# Patient Record
Sex: Female | Born: 1960 | Race: White | Hispanic: No | Marital: Married | Smoking: Never smoker
Health system: Southern US, Community
[De-identification: ages and names within clinical notes are randomized; demographics above are authoritative.]

## PROBLEM LIST (undated history)

## (undated) DIAGNOSIS — I10 Essential (primary) hypertension: Secondary | ICD-10-CM

## (undated) DIAGNOSIS — J45909 Unspecified asthma, uncomplicated: Secondary | ICD-10-CM

## (undated) DIAGNOSIS — T4145XA Adverse effect of unspecified anesthetic, initial encounter: Secondary | ICD-10-CM

## (undated) DIAGNOSIS — T8859XA Other complications of anesthesia, initial encounter: Secondary | ICD-10-CM

## (undated) DIAGNOSIS — Z9889 Other specified postprocedural states: Secondary | ICD-10-CM

## (undated) DIAGNOSIS — R112 Nausea with vomiting, unspecified: Secondary | ICD-10-CM

## (undated) DIAGNOSIS — K219 Gastro-esophageal reflux disease without esophagitis: Secondary | ICD-10-CM

## (undated) DIAGNOSIS — R609 Edema, unspecified: Secondary | ICD-10-CM

## (undated) DIAGNOSIS — F419 Anxiety disorder, unspecified: Secondary | ICD-10-CM

## (undated) DIAGNOSIS — IMO0001 Reserved for inherently not codable concepts without codable children: Secondary | ICD-10-CM

## (undated) DIAGNOSIS — M1990 Unspecified osteoarthritis, unspecified site: Secondary | ICD-10-CM

## (undated) HISTORY — DX: Unspecified osteoarthritis, unspecified site: M19.90

## (undated) HISTORY — DX: Essential (primary) hypertension: I10

## (undated) HISTORY — PX: FOOT SURGERY: SHX648

## (undated) HISTORY — PX: JOINT REPLACEMENT: SHX530

## (undated) HISTORY — DX: Gastro-esophageal reflux disease without esophagitis: K21.9

## (undated) HISTORY — DX: Reserved for inherently not codable concepts without codable children: IMO0001

## (undated) HISTORY — DX: Edema, unspecified: R60.9

---

## 2005-04-25 HISTORY — PX: TOTAL KNEE ARTHROPLASTY: SHX125

## 2006-03-27 ENCOUNTER — Inpatient Hospital Stay (HOSPITAL_COMMUNITY): Admission: RE | Admit: 2006-03-27 | Discharge: 2006-03-30 | Payer: Self-pay | Admitting: Orthopedic Surgery

## 2009-04-25 HISTORY — PX: TOTAL ABDOMINAL HYSTERECTOMY: SHX209

## 2010-11-18 ENCOUNTER — Encounter: Payer: Self-pay | Admitting: Cardiovascular Disease

## 2010-11-18 ENCOUNTER — Ambulatory Visit (INDEPENDENT_AMBULATORY_CARE_PROVIDER_SITE_OTHER): Payer: PRIVATE HEALTH INSURANCE | Admitting: Cardiovascular Disease

## 2010-11-18 ENCOUNTER — Encounter: Payer: Self-pay | Admitting: *Deleted

## 2010-11-18 DIAGNOSIS — R079 Chest pain, unspecified: Secondary | ICD-10-CM | POA: Insufficient documentation

## 2010-11-18 DIAGNOSIS — R011 Cardiac murmur, unspecified: Secondary | ICD-10-CM | POA: Insufficient documentation

## 2010-11-18 NOTE — Assessment & Plan Note (Signed)
The patient seems to have 2 different types of chest discomfort. Her recent sharp chest pain seems to be pleuritic in nature and that seems to be improving. However, the chest tightness is somewhat concerning especially that it's associated with significant exertional dyspnea. It's not always exertional though and has been happening at rest on few occasions. I think this require further investigation. I recommend evaluation with a nuclear stress test to rule out underlying cardiac ischemia. In the meantime, I asked her to take aspirin 81 mg once daily.

## 2010-11-18 NOTE — Assessment & Plan Note (Signed)
The patient does have a heart murmur which seems to be suggestive of aortic sclerosis. However, due to her symptoms of dyspnea I will go ahead and obtain an echocardiogram to evaluate for other etiologies. She will followup after her cardiac testing.

## 2010-11-18 NOTE — Progress Notes (Signed)
HPI  This is a 50 year old female who is referred by Dr. Manson Passey for evaluation of chest pain and dyspnea. The patient has been having symptoms of substernal chest tightness which usually lasts for a few minutes and can happen at rest or with activities over the last 5-6 weeks. This is associated with increased exertional dyspnea. This last weekend she had a different kind of discomfort with what which was sharp radiating to her back and was worse with breathing. She went to urgent care and was diagnosed with bronchitis and possible pleurisy. She was given antibiotics. That discomfort seems to be better. However it is different from the other substernal chest tightness that she's been having for about 6 weeks now. She denies any previous cardiac history. She had an ECG done which showed normal sinus rhythm without evidence of ischemia. Her labs showed negative troponin and overall were unremarkable. She denies any orthopnea, PND or lower extremity edema. There is no reported palpitations, syncope or presyncope. She takes metoprolol for hypertension. She doesn't know if she has hyperlipidemia or not. The patient does not exercise regularly but she is very active at her work which requires her to walk about 5 positive a day. 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  Allergies  Allergen Reactions  . Frovatriptan      No current outpatient prescriptions on file prior to visit.     Past Medical History  Diagnosis Date  . Hypertension   . Reflux   . Osteoarthritis      Past Surgical History  Procedure Date  . Total knee arthroplasty 2007    left  . Total abdominal hysterectomy 2011     No family history on file.   History   Social History  . Marital Status: Married    Spouse Name: N/A    Number of Children: N/A  . Years of Education: N/A   Occupational History  . Not on file.   Social History Main Topics  . Smoking status: Never Smoker   . Smokeless tobacco: Never  Used  . Alcohol Use: Not on file  . Drug Use: Not on file  . Sexually Active: Not on file   Other Topics Concern  . Not on file   Social History Narrative  . No narrative on file     ROS Constitutional: Negative for fever, chills, diaphoresis, activity change, appetite change and fatigue.  HENT: Negative for hearing loss, nosebleeds, congestion, sore throat, facial swelling, drooling, trouble swallowing, neck pain, voice change, sinus pressure and tinnitus.  Eyes: Negative for photophobia, pain, discharge and visual disturbance.  Respiratory: Negative for apnea, cough  and wheezing.  Cardiovascular: Negative for palpitations and leg swelling.  Gastrointestinal: Negative for nausea, vomiting, abdominal pain, diarrhea, constipation, blood in stool and abdominal distention.  Genitourinary: Negative for dysuria, urgency, frequency, hematuria and decreased urine volume.  Musculoskeletal: Negative for myalgias, back pain, joint swelling, arthralgias and gait problem.  Skin: Negative for color change, pallor, rash and wound.  Neurological: Negative for dizziness, tremors, seizures, syncope, speech difficulty, weakness, light-headedness, numbness and headaches.  Psychiatric/Behavioral: Negative for suicidal ideas, hallucinations, behavioral problems and agitation. The patient is not nervous/anxious.     PHYSICAL EXAM   BP 142/86  Pulse 61  Ht 5\' 11"  (1.803 m)  Wt 202 lb (91.627 kg)  BMI 28.17 kg/m2  SpO2 97%  Constitutional: She is oriented to person, place, and time. She appears well-developed and well-nourished. No distress.  HENT: No nasal  discharge.  Head: Normocephalic and atraumatic.  Eyes: Pupils are equal, round, and reactive to light. Right eye exhibits no discharge. Left eye exhibits no discharge.  Neck: Normal range of motion. Neck supple. No JVD present. No thyromegaly present.  Cardiovascular: Normal rate, regular rhythm, normal heart sounds and intact distal pulses.  Exam reveals no gallop and no friction rub.  There is a 2/6 systolic ejection murmur in the aortic area as well as the left sternal border. Pulmonary/Chest: Effort normal and breath sounds normal. No stridor. No respiratory distress. She has no wheezes. She has no rales. She exhibits no tenderness.  Abdominal: Soft. Bowel sounds are normal. She exhibits no distension. There is no tenderness. There is no rebound and no guarding.  Musculoskeletal: Normal range of motion. She exhibits no edema and no tenderness.  Neurological: She is alert and oriented to person, place, and time. Coordination normal.  Skin: Skin is warm and dry. No rash noted. She is not diaphoretic. No erythema. No pallor.  Psychiatric: She has a normal mood and affect. Her behavior is normal. Judgment and thought content normal.    EKG: Recent ECG was reviewed and showed normal sinus rhythm without significant ST or T wave changes. No evidence of prior infarcts.   ASSESSMENT AND PLAN

## 2010-11-18 NOTE — Patient Instructions (Signed)
Your physician recommends that you schedule a follow-up appointment in: AFTER TESTING  Your physician has requested that you have en exercise stress myoview. For further information please visit [email or web address] Please follow instruction sheet, as given.AT [facility]   Your physician has requested that you have an echocardiogram. Echocardiography is a painless test that uses sound waves to create images of your heart. It provides your doctor with information about the size and shape of your heart and how well your heart's chambers and valves are working. This procedure takes approximately one hour. There are no restrictions for this procedure.AT [facility]

## 2010-11-29 ENCOUNTER — Ambulatory Visit (INDEPENDENT_AMBULATORY_CARE_PROVIDER_SITE_OTHER): Payer: PRIVATE HEALTH INSURANCE | Admitting: Cardiovascular Disease

## 2010-11-29 ENCOUNTER — Encounter: Payer: Self-pay | Admitting: Cardiovascular Disease

## 2010-11-29 DIAGNOSIS — R079 Chest pain, unspecified: Secondary | ICD-10-CM

## 2010-11-29 DIAGNOSIS — R42 Dizziness and giddiness: Secondary | ICD-10-CM

## 2010-11-29 DIAGNOSIS — R011 Cardiac murmur, unspecified: Secondary | ICD-10-CM

## 2010-11-29 NOTE — Assessment & Plan Note (Signed)
The patient had a nuclear stress test performed which showed no evidence of ischemia with normal ejection fraction of 56%. She was able to exercise for 7 minutes and 30 seconds without any chest pain or ischemic EKG changes. No further cardiac evaluation is needed at this time. If her symptoms do not improve in the next few weeks, I advised her to followup with her primary care physician to see if any further noncardiac workup is needed.

## 2010-11-29 NOTE — Assessment & Plan Note (Signed)
Her echocardiogram showed normal LV systolic function without evidence of bicuspid aortic valve or other significant valvular abnormalities. There was trace tricuspid and pulmonic regurgitation. The murmur is likely functional.

## 2010-11-29 NOTE — Assessment & Plan Note (Signed)
This was a one episode only. She is not orthostatic today by physical exam.

## 2010-11-29 NOTE — Progress Notes (Signed)
HPI  This is a 50 year old female who is here today for a followup visit. She was seen recently for evaluation of chest pain as well as dyspnea. She was found to have a faint cardiac murmur. She had cardiac evaluation which included a nuclear stress test as well as an echocardiogram. Both of them came back unremarkable. Overall, she feels slightly better. She still gets mild chest discomfort which is not exertional. Most of her symptoms seems to be happening when she is changing her environment from a cold or hot place or vice versa. Today she had an episode of dizziness without presyncope or syncope.  Allergies  Allergen Reactions  . Frovatriptan      Current Outpatient Prescriptions on File Prior to Visit  Medication Sig Dispense Refill  . estrogens, conjugated, (PREMARIN) 0.625 MG tablet Take 0.625 mg by mouth daily. Take daily for 21 days then do not take for 7 days.       . metoprolol tartrate (LOPRESSOR) 25 MG tablet Take 25 mg by mouth daily.        Marland Kitchen omeprazole (PRILOSEC) 20 MG capsule Take 20 mg by mouth daily.           Past Medical History  Diagnosis Date  . Hypertension   . Reflux   . Osteoarthritis      Past Surgical History  Procedure Date  . Total knee arthroplasty 2007    left  . Total abdominal hysterectomy 2011     History reviewed. No pertinent family history.   History   Social History  . Marital Status: Married    Spouse Name: N/A    Number of Children: N/A  . Years of Education: N/A   Occupational History  . Not on file.   Social History Main Topics  . Smoking status: Never Smoker   . Smokeless tobacco: Never Used  . Alcohol Use: Not on file  . Drug Use: Not on file  . Sexually Active: Not on file   Other Topics Concern  . Not on file   Social History Narrative  . No narrative on file       PHYSICAL EXAM   BP 132/86  Pulse 80  Ht 5\' 11"  (1.803 m)  Wt 203 lb (92.08 kg)  BMI 28.31 kg/m2  SpO2 98%  Constitutional: She is  oriented to person, place, and time. She appears well-developed and well-nourished. No distress.  HENT: No nasal discharge.  Head: Normocephalic and atraumatic.  Eyes: Pupils are equal, round, and reactive to light. Right eye exhibits no discharge. Left eye exhibits no discharge.  Neck: Normal range of motion. Neck supple. No JVD present. No thyromegaly present.  Cardiovascular: Normal rate, regular rhythm, normal heart sounds and intact distal pulses. Exam reveals no gallop and no friction rub.  There is a 1/6 systolic ejection murmur in the aortic area. Pulmonary/Chest: Effort normal and breath sounds normal. No stridor. No respiratory distress. She has no wheezes. She has no rales. She exhibits no tenderness.  Abdominal: Soft. Bowel sounds are normal. She exhibits no distension. There is no tenderness. There is no rebound and no guarding.  Musculoskeletal: Normal range of motion. She exhibits no edema and no tenderness.  Neurological: She is alert and oriented to person, place, and time. Coordination normal.  Skin: Skin is warm and dry. No rash noted. She is not diaphoretic. No erythema. No pallor.  Psychiatric: She has a normal mood and affect. Her behavior is normal. Judgment and thought content normal.  ASSESSMENT AND PLAN

## 2010-11-29 NOTE — Patient Instructions (Signed)
Your physician recommends that you schedule a follow-up appointment in: as needed  

## 2011-06-14 ENCOUNTER — Other Ambulatory Visit (HOSPITAL_COMMUNITY): Payer: Self-pay | Admitting: Orthopedic Surgery

## 2011-06-14 DIAGNOSIS — M25569 Pain in unspecified knee: Secondary | ICD-10-CM

## 2011-06-22 ENCOUNTER — Ambulatory Visit (HOSPITAL_COMMUNITY)
Admission: RE | Admit: 2011-06-22 | Discharge: 2011-06-22 | Disposition: A | Discharge status: Home / Self Care | Payer: Commercial Managed Care - PPO | Source: Ambulatory Visit | Attending: Orthopedic Surgery | Admitting: Orthopedic Surgery

## 2011-06-22 DIAGNOSIS — M25569 Pain in unspecified knee: Secondary | ICD-10-CM | POA: Insufficient documentation

## 2011-06-22 DIAGNOSIS — M1710 Unilateral primary osteoarthritis, unspecified knee: Secondary | ICD-10-CM | POA: Insufficient documentation

## 2011-06-22 DIAGNOSIS — IMO0002 Reserved for concepts with insufficient information to code with codable children: Secondary | ICD-10-CM | POA: Insufficient documentation

## 2011-07-15 ENCOUNTER — Encounter (HOSPITAL_BASED_OUTPATIENT_CLINIC_OR_DEPARTMENT_OTHER): Payer: Self-pay | Admitting: *Deleted

## 2011-07-20 ENCOUNTER — Encounter (HOSPITAL_BASED_OUTPATIENT_CLINIC_OR_DEPARTMENT_OTHER)
Admission: RE | Admit: 2011-07-20 | Discharge: 2011-07-20 | Disposition: A | Discharge status: Home / Self Care | Payer: Commercial Managed Care - PPO | Source: Ambulatory Visit | Attending: Orthopedic Surgery | Admitting: Orthopedic Surgery

## 2011-07-20 LAB — BASIC METABOLIC PANEL
BUN: 14 mg/dL (ref 6–23)
Calcium: 9.9 mg/dL (ref 8.4–10.5)
Chloride: 106 mEq/L (ref 96–112)
Creatinine, Ser: 0.73 mg/dL (ref 0.50–1.10)
GFR calc Af Amer: >90 mL/min (ref >90)
GFR calc non Af Amer: >90 mL/min (ref >90)
Glucose, Bld: 107 mg/dL — ABNORMAL HIGH (ref 70–99)
Potassium: 4.1 mEq/L (ref 3.5–5.1)
Sodium: 142 mEq/L (ref 135–145)

## 2011-07-20 NOTE — H&P (Signed)
Rhonda [name]/[facility] [address]   [address] [place], [place] [address] [phone] A Division of [facility]  Rhonda [name], M.D.     Robert A. Thurston Hole, M.D.     Lunette Stands, M.D. Eulas Post, M.D.    Buford Dresser, M.D. Estell Harpin, M.D. Ralene Cork, D.O.          Genene Churn. Barry Dienes, PA-C            Kirstin A. Shepperson, PA-C [place], OPA-C   RE: Rhonda, [name]                                [phone]      DOB: [birth year] PROGRESS NOTE: 06-14-11 Rhonda [name] returns for follow up.  Left total knee replacement by me in 2007.  Episode of aggravation treated with injection back last year.  Left knee continues to do great.  No issues.  Coming in for her right knee.  Symptoms two months.  She had a twist with something popping.  Ever since then she has had a persistent feeling of some degree of instability and mechanical symptoms on the lateral aspect of her knee.  Rest, anti-inflammatory medication and alteration of activity without improvement.  She comes in for evaluation and treatment recommendation.  Getting worse and worse rather than better.   History is reviewed, updated and included in the chart.  EXAMINATION: General exam is outlined and included in the chart.  Specifically, right knee point tender lateral joint line.  Positive lateral McMurray's.  Not a lot of grating or crepitus.  Her ligaments feel stable.  Extensor mechanism is intact.  Neurovascularly intact distally.  Opposite left knee has motion from 0-125 degrees.  No swelling.  No tenderness.  X-RAYS: Four view x-ray shows excellent seating and alignment of the prosthesis on the left.  Right knee has very little in the way of any degenerative changes.    IMPRESSION: Probable lateral meniscus tear, right knee.  PLAN: Sufficient mechanical symptoms to warrant workup and possible treatment.  MRI to outline pathology.  Follow up when that is  complete.  If she has a definite meniscus tear it is obviously going to come down to arthroscopic debridement.  Final decision after we see her scan.    Rhonda [name], M.D.   Electronically verified by Rhonda [name], M.D. DFM:jjh D 06-15-11 T 06-16-11  [facility] Catarino/[facility] [address]   [address] [place], [place] [address] [phone] A Division of [facility]  Rhonda [name], M.D.     Robert A. Thurston Hole, M.D.     Lunette Stands, M.D. Eulas Post, M.D.    Buford Dresser, M.D. Estell Harpin, M.D. Ralene Cork, D.O.          Genene Churn. Barry Dienes, PA-C            Kirstin A. Shepperson, PA-C [place], OPA-C   RE: Rhonda, [name]                                [phone]      DOB: 20-Mar-1961 PROGRESS NOTE: 07-05-11 Rhonda [name] comes in for reevaluation of her right knee.  Recent MRI complete.  I have looked at the scan and report.  There are degenerative changes, especially patellofemoral  joint, but the other compartments don't look too bad.  Marked degenerative changes throughout the meniscus, especially on the lateral side.  They are not seeing a definite tear, but there are definite intermeniscal degenerative changes throughout.  She continues to have marked symptoms of locking pain and giving way very consistent with mechanical, rather than degenerative, symptoms.  She is obviously well aware of the underlying degenerative arthritis.  She went through a debriding arthroscopy in her opposite left knee that never helped her much and she eventually ended up with a left total knee replacement done in 2007.  That side continues to do well.  Both she and I agree that she doesn't have, at this point in time, sufficient symptoms to go in the direction of joint replacement on the right.  We have maximized treatment otherwise without improvement.  Today we have discussed an assessing and debriding arthroscopy.  This has to be done with a  very cautious outlook and she thoroughly understands that.  We have discussed exam under anesthesia, arthroscopy, care of any meniscal tears and chondroplasty throughout.  Procedures, risks, benefits and complications reviewed.  Obviously the outcome is going to be very dependent on the degree of degenerative changes that are found.  Both she and I are hopeful that we can find enough in the way of meniscal tears and chondral debris that debridement alone is going to help matters.  At some point in the future I think she probably will come down to knee replacement, our goal is to try to put this off as long as possible and hopefully by 2-5 years with debriding arthroscopy.  Anticipated out of work at least six weeks after her procedure, again that is going to be dependent on what we find.  All questions answered.  Paperwork complete.  I will see her at the time of operative intervention.    Rhonda [name], M.D.   Electronically verified by Rhonda [name], M.D. DFM:jjh D 07-06-11 T 07-07-11

## 2011-07-21 ENCOUNTER — Encounter (HOSPITAL_BASED_OUTPATIENT_CLINIC_OR_DEPARTMENT_OTHER)
Admission: RE | Disposition: A | Discharge status: Home / Self Care | Payer: Self-pay | Source: Ambulatory Visit | Attending: Orthopedic Surgery

## 2011-07-21 ENCOUNTER — Encounter (HOSPITAL_BASED_OUTPATIENT_CLINIC_OR_DEPARTMENT_OTHER): Payer: Self-pay | Admitting: Anesthesiology

## 2011-07-21 ENCOUNTER — Ambulatory Visit (HOSPITAL_BASED_OUTPATIENT_CLINIC_OR_DEPARTMENT_OTHER): Payer: Commercial Managed Care - PPO | Admitting: Anesthesiology

## 2011-07-21 ENCOUNTER — Encounter (HOSPITAL_BASED_OUTPATIENT_CLINIC_OR_DEPARTMENT_OTHER): Payer: Self-pay | Admitting: *Deleted

## 2011-07-21 ENCOUNTER — Ambulatory Visit (HOSPITAL_BASED_OUTPATIENT_CLINIC_OR_DEPARTMENT_OTHER)
Admission: RE | Admit: 2011-07-21 | Discharge: 2011-07-21 | Disposition: A | Discharge status: Home / Self Care | Payer: Commercial Managed Care - PPO | Source: Ambulatory Visit | Attending: Orthopedic Surgery | Admitting: Orthopedic Surgery

## 2011-07-21 DIAGNOSIS — M2340 Loose body in knee, unspecified knee: Secondary | ICD-10-CM | POA: Insufficient documentation

## 2011-07-21 DIAGNOSIS — M23302 Other meniscus derangements, unspecified lateral meniscus, unspecified knee: Secondary | ICD-10-CM | POA: Insufficient documentation

## 2011-07-21 DIAGNOSIS — Z01812 Encounter for preprocedural laboratory examination: Secondary | ICD-10-CM | POA: Insufficient documentation

## 2011-07-21 DIAGNOSIS — M1710 Unilateral primary osteoarthritis, unspecified knee: Secondary | ICD-10-CM | POA: Insufficient documentation

## 2011-07-21 DIAGNOSIS — Z4789 Encounter for other orthopedic aftercare: Secondary | ICD-10-CM

## 2011-07-21 DIAGNOSIS — M2240 Chondromalacia patellae, unspecified knee: Secondary | ICD-10-CM | POA: Insufficient documentation

## 2011-07-21 DIAGNOSIS — K219 Gastro-esophageal reflux disease without esophagitis: Secondary | ICD-10-CM | POA: Insufficient documentation

## 2011-07-21 DIAGNOSIS — I10 Essential (primary) hypertension: Secondary | ICD-10-CM | POA: Insufficient documentation

## 2011-07-21 HISTORY — DX: Adverse effect of unspecified anesthetic, initial encounter: T41.45XA

## 2011-07-21 HISTORY — DX: Gastro-esophageal reflux disease without esophagitis: K21.9

## 2011-07-21 HISTORY — DX: Other complications of anesthesia, initial encounter: T88.59XA

## 2011-07-21 HISTORY — DX: Other specified postprocedural states: Z98.890

## 2011-07-21 HISTORY — DX: Other specified postprocedural states: R11.2

## 2011-07-21 SURGERY — ARTHROSCOPY, KNEE, WITH MENISCUS REPAIR
Anesthesia: General | Site: Knee | Laterality: Right | Wound class: Clean

## 2011-07-21 MED ORDER — LIDOCAINE HCL (CARDIAC) 20 MG/ML IV SOLN
INTRAVENOUS | Status: DC | PRN
  Administered 2011-07-21: 75 mg via INTRAVENOUS

## 2011-07-21 MED ORDER — SCOPOLAMINE 1 MG/3DAYS TD PT72
1.0000 | MEDICATED_PATCH | Freq: Once | TRANSDERMAL | Status: DC
  Administered 2011-07-21: 1.5 mg via TRANSDERMAL

## 2011-07-21 MED ORDER — FENTANYL CITRATE 0.05 MG/ML IJ SOLN
INTRAMUSCULAR | Status: DC | PRN
  Administered 2011-07-21: 25 ug via INTRAVENOUS
  Administered 2011-07-21 (×2): 50 ug via INTRAVENOUS

## 2011-07-21 MED ORDER — METHYLPREDNISOLONE ACETATE 80 MG/ML IJ SUSP
INTRAMUSCULAR | Status: DC | PRN
  Administered 2011-07-21: 80 mg

## 2011-07-21 MED ORDER — SODIUM CHLORIDE 0.9 % IR SOLN
Status: DC | PRN
  Administered 2011-07-21: 3000 mL

## 2011-07-21 MED ORDER — CEFAZOLIN SODIUM-DEXTROSE 2-3 GM-% IV SOLR
2.0000 g | INTRAVENOUS | Status: AC
  Administered 2011-07-21: 2 g via INTRAVENOUS

## 2011-07-21 MED ORDER — LORAZEPAM 2 MG/ML IJ SOLN: 1.0000 mg | Freq: Once | INTRAMUSCULAR | Status: DC | PRN

## 2011-07-21 MED ORDER — LACTATED RINGERS IV SOLN
INTRAVENOUS | Status: DC
  Administered 2011-07-21 (×2): via INTRAVENOUS

## 2011-07-21 MED ORDER — DEXAMETHASONE SODIUM PHOSPHATE 4 MG/ML IJ SOLN
INTRAMUSCULAR | Status: DC | PRN
  Administered 2011-07-21: 10 mg via INTRAVENOUS

## 2011-07-21 MED ORDER — MIDAZOLAM HCL 2 MG/2ML IJ SOLN: 1.0000 mg | INTRAMUSCULAR | Status: DC | PRN

## 2011-07-21 MED ORDER — PROPOFOL 10 MG/ML IV EMUL
INTRAVENOUS | Status: DC | PRN
  Administered 2011-07-21: 200 mg via INTRAVENOUS

## 2011-07-21 MED ORDER — HYDROMORPHONE HCL PF 1 MG/ML IJ SOLN
0.2500 mg | INTRAMUSCULAR | Status: DC | PRN
  Administered 2011-07-21: 0.5 mg via INTRAVENOUS

## 2011-07-21 MED ORDER — BUPIVACAINE HCL (PF) 0.25 % IJ SOLN
INTRAMUSCULAR | Status: DC | PRN
  Administered 2011-07-21: 19 mL

## 2011-07-21 MED ORDER — FENTANYL CITRATE 0.05 MG/ML IJ SOLN: 50.0000 ug | INTRAMUSCULAR | Status: DC | PRN

## 2011-07-21 MED ORDER — CEFAZOLIN SODIUM 1-5 GM-% IV SOLN: 1.0000 g | INTRAVENOUS | Status: DC

## 2011-07-21 MED ORDER — MIDAZOLAM HCL 5 MG/5ML IJ SOLN
INTRAMUSCULAR | Status: DC | PRN
  Administered 2011-07-21: 2 mg via INTRAVENOUS

## 2011-07-21 SURGICAL SUPPLY — 43 items
BANDAGE ELASTIC 6 VELCRO ST LF (GAUZE/BANDAGES/DRESSINGS) ×2 IMPLANT
BLADE CUDA 5.5 (BLADE) IMPLANT
BLADE CUDA GRT WHITE 3.5 (BLADE) IMPLANT
BLADE CUTTER GATOR 3.5 (BLADE) ×2 IMPLANT
BLADE CUTTER MENIS 5.5 (BLADE) IMPLANT
BLADE GREAT WHITE 4.2 (BLADE) ×2 IMPLANT
BUR OVAL 4.0 (BURR) IMPLANT
CANISTER OMNI JUG 16 LITER (MISCELLANEOUS) ×2 IMPLANT
CANISTER SUCTION 2500CC (MISCELLANEOUS) IMPLANT
CLOTH BEACON ORANGE TIMEOUT ST (SAFETY) ×2 IMPLANT
CUTTER KNOT PUSHER 2-0 FIBERWI (INSTRUMENTS) IMPLANT
CUTTER MENISCUS  4.2MM (BLADE)
CUTTER MENISCUS 4.2MM (BLADE) IMPLANT
DRAPE ARTHROSCOPY W/POUCH 90 (DRAPES) ×2 IMPLANT
DRSG PAD ABDOMINAL 8X10 ST (GAUZE/BANDAGES/DRESSINGS) ×1 IMPLANT
DURAPREP 26ML APPLICATOR (WOUND CARE) ×2 IMPLANT
ELECT MENISCUS 165MM 90D (ELECTRODE) IMPLANT
ELECT REM PT RETURN 9FT ADLT (ELECTROSURGICAL)
ELECTRODE REM PT RTRN 9FT ADLT (ELECTROSURGICAL) IMPLANT
GAUZE XEROFORM 1X8 LF (GAUZE/BANDAGES/DRESSINGS) ×2 IMPLANT
GLOVE BIOGEL M 7.0 STRL (GLOVE) ×1 IMPLANT
GLOVE BIOGEL PI IND STRL 7.5 (GLOVE) IMPLANT
GLOVE BIOGEL PI IND STRL 8 (GLOVE) ×1 IMPLANT
GLOVE BIOGEL PI INDICATOR 7.5 (GLOVE) ×1
GLOVE BIOGEL PI INDICATOR 8 (GLOVE) ×1
GLOVE ORTHO TXT STRL SZ7.5 (GLOVE) ×4 IMPLANT
GOWN BRE IMP PREV XXLGXLNG (GOWN DISPOSABLE) ×2 IMPLANT
GOWN PREVENTION PLUS XLARGE (GOWN DISPOSABLE) ×2 IMPLANT
GOWN PREVENTION PLUS XXLARGE (GOWN DISPOSABLE) ×1 IMPLANT
HOLDER KNEE FOAM BLUE (MISCELLANEOUS) ×2 IMPLANT
KNEE WRAP E Z 3 GEL PACK (MISCELLANEOUS) ×1 IMPLANT
PACK ARTHROSCOPY DSU (CUSTOM PROCEDURE TRAY) ×2 IMPLANT
PACK BASIN DAY SURGERY FS (CUSTOM PROCEDURE TRAY) ×2 IMPLANT
PAD CAST 4YDX4 CTTN HI CHSV (CAST SUPPLIES) IMPLANT
PADDING CAST COTTON 4X4 STRL (CAST SUPPLIES) ×2
PENCIL BUTTON HOLSTER BLD 10FT (ELECTRODE) IMPLANT
SET ARTHROSCOPY TUBING (MISCELLANEOUS) ×2
SET ARTHROSCOPY TUBING LN (MISCELLANEOUS) ×1 IMPLANT
SPONGE GAUZE 4X4 12PLY (GAUZE/BANDAGES/DRESSINGS) ×3 IMPLANT
SUT ETHILON 3 0 PS 1 (SUTURE) ×2 IMPLANT
SUT VIC AB 3-0 FS2 27 (SUTURE) IMPLANT
TOWEL OR 17X24 6PK STRL BLUE (TOWEL DISPOSABLE) ×2 IMPLANT
WATER STERILE IRR 1000ML POUR (IV SOLUTION) ×2 IMPLANT

## 2011-07-21 NOTE — Transfer of Care (Signed)
Immediate Anesthesia Transfer of Care Note  Patient: Rhonda [name]  Procedure(s) Performed: Procedure(s) (LRB): KNEE ARTHROSCOPY WITH MENISCAL REPAIR (Right)  Patient Location: PACU  Anesthesia Type: General  Level of Consciousness: awake, alert  and oriented  Airway & Oxygen Therapy: Patient Spontanous Breathing and Patient connected to face mask oxygen  Post-op Assessment: Report given to PACU RN and Post -op Vital signs reviewed and stable  Post vital signs: Reviewed and stable  Complications: No apparent anesthesia complications

## 2011-07-21 NOTE — Discharge Instructions (Signed)
  Post Anesthesia Home Care Instructions  Activity: Get plenty of rest for the remainder of the day. A responsible adult should stay with you for 24 hours following the procedure.  For the next 24 hours, DO NOT: -Drive a car -Advertising copywriter -Drink alcoholic beverages -Take any medication unless instructed by your physician -Make any legal decisions or sign important papers.  Meals: Start with liquid foods such as gelatin or soup. Progress to regular foods as tolerated. Avoid greasy, spicy, heavy foods. If nausea and/or vomiting occur, drink only clear liquids until the nausea and/or vomiting subsides. Call your physician if vomiting continues.  Special Instructions/Symptoms: Your throat may feel dry or sore from the anesthesia or the breathing tube placed in your throat during surgery. If this causes discomfort, gargle with warm salt water. The discomfort should disappear within 24 hours.   Call your surgeon if you experience:   1.  Fever over 101.0. 2.  Inability to urinate. 3.  Nausea and/or vomiting. 4.  Extreme swelling or bruising at the surgical site. 5.  Continued bleeding from the incision. 6.  Increased pain, redness or drainage from the incision. 7.  Problems related to your pain medication.    Discharge Instructions After Orthopedic Procedures:  *You may feel tired and weak following your procedure. It is recommended that you limit physical activity for the next 24 hours and rest at home for the remainder of today and tomorrow. *No strenuous activity should be started without your doctor's permission.  Elevate the extremity that you had surgery on to a level above your heart. This should continue for 48 hours or as instructed by your doctor.  If you had hand, arm or shoulder surgery you should move your fingers frequently unless otherwise instructed by your doctor.  If you had foot, knee or leg surgery you should wiggle your toes frequently unless otherwise  instructed by your doctor.  Follow your doctor's exact instructions for activity at home. Use your home equipment as instructed. (Crutches, hard shoes, slings etc.)  Limit your activity as instructed by your doctor.  Report to your doctor should any of the following occur: 1. Extreme swelling of your fingers or toes. 2. Inability to wiggle your fingers or toes. 3. Coldness, pale or bluish color in your fingers or toes. 4. Loss of sensation, numbness or tingling of your fingers or toes. 5. Unusual smell or odor from under your dressing or cast. 6. Excessive bleeding or drainage from the surgical site. 7. Pain not relieved by medication your doctor has prescribed for you. 8. Cast or dressing too tight (do not get your dressing or cast wet or put anything under your dressing or cast.)  *Do not change your dressing unless instructed by your doctor or discharge nurse. Then follow exact instructions.  *Follow labeled instructions for any medications that your doctor may have prescribed for you. *Should any questions or complications develop following your procedure, PLEASE CONTACT YOUR DOCTOR.

## 2011-07-21 NOTE — Anesthesia Postprocedure Evaluation (Signed)
  Anesthesia Post-op Note  Patient: Rhonda [name]  Procedure(s) Performed: Procedure(s) (LRB): KNEE ARTHROSCOPY WITH MENISCAL REPAIR (Right)  Patient Location: PACU  Anesthesia Type: General  Level of Consciousness: awake  Airway and Oxygen Therapy: Patient Spontanous Breathing  Post-op Pain: mild  Post-op Assessment: Post-op Vital signs reviewed, Patient's Cardiovascular Status Stable, Respiratory Function Stable, Patent Airway, No signs of Nausea or vomiting and Pain level controlled  Post-op Vital Signs: stable  Complications: No apparent anesthesia complications

## 2011-07-21 NOTE — Anesthesia Preprocedure Evaluation (Signed)
Anesthesia Evaluation  Patient identified by MRN, date of birth, ID band Patient awake    Reviewed: Allergy & Precautions, H&P , NPO status , Patient's Chart, lab work & pertinent test results  History of Anesthesia Complications (+) PONV  Airway Mallampati: I TM Distance: >3 FB Neck ROM: Full    Dental   Pulmonary    Pulmonary exam normal       Cardiovascular hypertension,     Neuro/Psych    GI/Hepatic GERD-  ,  Endo/Other    Renal/GU      Musculoskeletal   Abdominal   Peds  Hematology   Anesthesia Other Findings   Reproductive/Obstetrics                           Anesthesia Physical Anesthesia Plan  ASA: II  Anesthesia Plan: General   Post-op Pain Management:    Induction: Intravenous  Airway Management Planned: LMA  Additional Equipment:   Intra-op Plan:   Post-operative Plan: Extubation in OR  Informed Consent: I have reviewed the patients History and Physical, chart, labs and discussed the procedure including the risks, benefits and alternatives for the proposed anesthesia with the patient or authorized representative who has indicated his/her understanding and acceptance.     Plan Discussed with: CRNA and Surgeon  Anesthesia Plan Comments:         Anesthesia Quick Evaluation

## 2011-07-21 NOTE — Brief Op Note (Signed)
07/21/2011  9:58 AM  PATIENT:  Rhonda [name]  51 y.o. female  PRE-OPERATIVE DIAGNOSIS:  right knee lmt, medial cartilage or meniscus tear, dislocation of knee  POST-OPERATIVE DIAGNOSIS:  right knee lmt, medial cartilage or meniscus tear, dislocation of knee  PROCEDURE:  Procedure(s) (LRB): Right KNEE ARTHROSCOPY with debridement, chondroplasty.  SURGEON:  Surgeon(s) and Role:    * Loreta Ave, MD - Primary  PHYSICIAN ASSISTANT: Zonia Kief M   ANESTHESIA:   general  EBL:  Total I/O In: 1000 [I.V.:1000] Out: -   SPECIMEN:  No Specimen  DISPOSITION OF SPECIMEN:  N/A  COUNTS:  YES  TOURNIQUET:  * No tourniquets in log *  PATIENT DISPOSITION:  PACU - hemodynamically stable.

## 2011-07-21 NOTE — Anesthesia Procedure Notes (Signed)
Procedure Name: LMA Insertion Date/Time: 07/21/2011 9:15 AM Performed by: Zenia Resides D Pre-anesthesia Checklist: Patient identified, Emergency Drugs available, Suction available, Patient being monitored and Timeout performed Patient Re-evaluated:Patient Re-evaluated prior to inductionOxygen Delivery Method: Circle System Utilized Preoxygenation: Pre-oxygenation with 100% oxygen Intubation Type: IV induction Ventilation: Mask ventilation without difficulty LMA: LMA with gastric port inserted LMA Size: 4.0 Number of attempts: 1 Placement Confirmation: positive ETCO2 and breath sounds checked- equal and bilateral Tube secured with: Tape Dental Injury: Teeth and Oropharynx as per pre-operative assessment

## 2011-07-21 NOTE — Interval H&P Note (Signed)
History and Physical Interval Note:  07/21/2011 7:35 AM  Rhonda [name]  has presented today for surgery, with the diagnosis of right knee lmt, medial cartilage or meniscus tear, dislocation of knee  The various methods of treatment have been discussed with the patient and family. After consideration of risks, benefits and other options for treatment, the patient has consented to  Procedure(s) (LRB): KNEE ARTHROSCOPY WITH MENISCAL REPAIR (Right) as a surgical intervention .  The patients' history has been reviewed, patient examined, no change in status, stable for surgery.  I have reviewed the patients' chart and labs.  Questions were answered to the patient's satisfaction.     [name] F

## 2011-07-23 NOTE — Op Note (Signed)
NAME:  Rhonda [name], Rhonda [name] NO.:  MEDICAL RECORD NO.:  [id number]  LOCATION:                                 FACILITY:  PHYSICIAN:  Loreta Ave, M.D. DATE OF BIRTH:  03-20-1961  DATE OF PROCEDURE:  07/21/2011 DATE OF DISCHARGE:                              OPERATIVE REPORT   PREOPERATIVE DIAGNOSES:  Right knee extensive tricompartmental degenerative arthritis.  Lateral meniscus tear.  POSTOPERATIVE DIAGNOSES:  Right knee extensive tricompartmental degenerative arthritis.  Lateral meniscus tear.  Grade 4 changes in entire patellofemoral joint, entire medial femoral condyle.  Lateral meniscus tear with anterior and lateral meniscal cyst.  Chondral debris throughout.  PROCEDURE:  Right knee exam under anesthesia, arthroscopy. Tricompartmental chondroplasty, removal of loose bodies.  Partial lateral meniscectomy with excision of lateral meniscal cyst.  SURGEON:  Loreta Ave, M.D.  ASSISTANT:  Genene Churn. Denton Meek.  ANESTHESIA:  General.  BLOOD LOSS:  Minimal.  SPECIMENS:  None.  CULTURES:  None.  COMPLICATION:  None.  DRESSINGS:  Soft compressive.  TOURNIQUET:  None employed.  PROCEDURE IN DETAIL:  The patient was brought to operating room, placed on the operating table in supine position.  After adequate anesthesia had been obtained, leg holder applied, leg prepped and draped in usual sterile fashion.  Two portals, one each medial and lateral parapatellar. Arthroscope introduced.  Knee distended and inspected.  Extensive grade 4 changes in entire patellofemoral joint.  Chondral flaps, loose bodies, debris cleared throughout.  Lateral tracking really not tethering. Cruciate ligaments intact.  Medially, no meniscal tear, but grade 4 changes in entire weightbearing dome medial femoral condyle with grade 3 on the tibial plateau.  Chondroplasty throughout.  Laterally, an anterior horn tear with a relatively large meniscal cyst excised.   The cartilage debrided and tapered in smoothly.  Grade 2 changes throughout that compartment. Entire knee examined to be sure all chondral fragments removed. Instruments and fluids were removed.  Portals closed with nylon.  Knee injected with Depo-Medrol and Marcaine.  Anesthesia reversed.  Brought to recovery room.  Tolerated surgery well.  No complications.     Loreta Ave, M.D.     DFM/MEDQ  D:  07/21/2011  T:  07/21/2011  Job:  [id number]

## 2013-02-06 ENCOUNTER — Encounter (INDEPENDENT_AMBULATORY_CARE_PROVIDER_SITE_OTHER): Payer: Self-pay

## 2013-02-06 ENCOUNTER — Ambulatory Visit (INDEPENDENT_AMBULATORY_CARE_PROVIDER_SITE_OTHER): Payer: Commercial Managed Care - PPO

## 2013-02-06 VITALS — BP 143/87 | HR 74 | Resp 18

## 2013-02-06 DIAGNOSIS — M79609 Pain in unspecified limb: Secondary | ICD-10-CM

## 2013-02-06 DIAGNOSIS — M898X9 Other specified disorders of bone, unspecified site: Secondary | ICD-10-CM

## 2013-02-06 DIAGNOSIS — M1990 Unspecified osteoarthritis, unspecified site: Secondary | ICD-10-CM

## 2013-02-06 NOTE — Progress Notes (Signed)
  Subjective:    [name], female    DOB: 1961-03-01, 52 y.o.   MRN: [id number]  Foot Pain This is a recurrent problem. The current episode started more than 1 year ago. The problem occurs constantly. The problem has been gradually worsening. The symptoms are aggravated by standing. She has tried heat and ice for the symptoms. The treatment provided mild relief.  hurts with shoe rubbing and used ben gay and has just reoccured. Patient continues to have pain on weightbearing in particular palpation over the first met cuneiform articulation. Left foot there is bony prominence palpated and confirmed her previous x-rays. There is also pain on any range of motion of the first ray with crepitus being noted.    Review of Systems  Constitutional: Negative.   HENT: Negative.   Eyes: Negative.   Respiratory: Negative.   Cardiovascular: Negative.   Gastrointestinal: Negative.   Endocrine: Negative.   Genitourinary: Negative.   Musculoskeletal: Negative.        Joint pain  Skin: Negative.   Allergic/Immunologic: Negative.   Neurological: Negative.   Hematological: Negative.   Psychiatric/Behavioral: Negative.        Objective:   Physical Exam  Constitutional: She is oriented to person, place, and time. She appears well-developed and well-nourished.  Cardiovascular:  Pulses:      Dorsalis pedis pulses are 2+ on the right side, and 2+ on the left side.       Posterior tibial pulses are 2+ on the right side, and 2+ on the left side.  Capillary refill timed 3-4 seconds all digits. Skin temperature warm turgor normal no edema or varicosities noted.  Musculoskeletal:  Clinically rectus foot type mild digital contractures flexible nature. All x-rays revealed inferior retrocalcaneal spurring. There is also some dorsal spurring of the medial cuneiform consistent with a bony prominence patient's concern with. Patient also has pain on range of motion with crepitus first met  cuneiform articulation. Also pain with weightbearing  Neurological: She is alert and oriented to person, place, and time. She has normal strength and normal reflexes.  Epicritic and proprioceptive sensations intact and symmetric bilateral. Normal plantar response and DTRs to  Skin: Skin is warm and dry. No cyanosis. Nails show no clubbing.  Skin color pigment normal hair growth diminished absent nails normal trophic. No open wounds or ulcerations identified.  Psychiatric: She has a normal mood and affect. Her behavior is normal.          Assessment & Plan:  Assessment assessment is arthrosis/DJD first met cuneiform left foot. With dorsal spurring of the first cuneiform. Associated soft tissue pain irritation with enclosed shoe wear as result of the bony prominence. At this time per patient request and discussion, which is to avoid steroid injections. Patient currently on Sterapred Dosepak which has not helped her symptoms, and only provided temporary relief in the past. At this time patient is instructed more permanent correction and understands that surgery is likely involved. The consent form for surgical intervention left was reviewed this time all questions asked by the patient are answered. Consent form for first 19 form arthrodesis left and tarsal exostectomy medial cuneiform left. Surgery scheduled at her convenience  Alvan Dame DPM

## 2013-02-06 NOTE — Patient Instructions (Signed)

## 2013-02-18 DIAGNOSIS — M779 Enthesopathy, unspecified: Secondary | ICD-10-CM

## 2013-02-18 DIAGNOSIS — M898X9 Other specified disorders of bone, unspecified site: Secondary | ICD-10-CM

## 2013-02-21 ENCOUNTER — Ambulatory Visit (INDEPENDENT_AMBULATORY_CARE_PROVIDER_SITE_OTHER): Payer: Commercial Managed Care - PPO

## 2013-02-21 VITALS — BP 137/85 | HR 65 | Resp 18

## 2013-02-21 DIAGNOSIS — M1990 Unspecified osteoarthritis, unspecified site: Secondary | ICD-10-CM

## 2013-02-21 DIAGNOSIS — M79672 Pain in left foot: Secondary | ICD-10-CM

## 2013-02-21 DIAGNOSIS — M898X9 Other specified disorders of bone, unspecified site: Secondary | ICD-10-CM

## 2013-02-21 DIAGNOSIS — Z09 Encounter for follow-up examination after completed treatment for conditions other than malignant neoplasm: Secondary | ICD-10-CM

## 2013-02-21 DIAGNOSIS — M79609 Pain in unspecified limb: Secondary | ICD-10-CM

## 2013-02-21 NOTE — Patient Instructions (Signed)

## 2013-02-21 NOTE — Progress Notes (Signed)
  Subjective:    [name], female    DOB: [birth year], 52 y.o.   MRN: [id number]  HPI surgery on 02/18/2013 left foot and feels wonderful and not been on it at all Patient is 3 days status post tarsal exostectomy left foot medial cuneiform, as well as medial column fusion tarsometatarsal joint first metatarsal medial cuneiform with screw fixation. Little or no complaints of pain. Patient maintain nonweightbearing lysing wheelchair and ruled out in wearing her air fracture boot. Review of Systems deferred at this     Objective:   Physical Exam Neurovascular status is intact and unchanged. Epicritic and proprioceptive sensations intact. Pedal pulses palpable DP postal for PT pulse one over incisions clean dry well coapted dorsum left foot Refill time 3 seconds all digits. Mild edema and ecchymosis noted. Dressings intact and dry are replaced with new dry sterile dressings. Maintain air fracture walker and nonweightbearing as instructed. Recheck in one week for further followup and dressing change. X-rays revealed good position of the osteotomy and screw placements first metatarsal and cuneiform site. Also adequate resection of bone dorsal medial cuneiform.     Assessment & Plan:  Good postop progress following medial column fusion tarsal metatarsal arthrodesis. As well as tarsal exostectomy. Minimal postoperative pain no complications at this time. Incision well coapted maintain nonweightbearing. Recheck in one week for followup  Alvan Dame DPM

## 2013-02-28 ENCOUNTER — Ambulatory Visit (INDEPENDENT_AMBULATORY_CARE_PROVIDER_SITE_OTHER): Payer: Commercial Managed Care - PPO

## 2013-02-28 VITALS — BP 130/87 | HR 84 | Resp 16

## 2013-02-28 DIAGNOSIS — M898X9 Other specified disorders of bone, unspecified site: Secondary | ICD-10-CM

## 2013-02-28 DIAGNOSIS — Z09 Encounter for follow-up examination after completed treatment for conditions other than malignant neoplasm: Secondary | ICD-10-CM

## 2013-02-28 DIAGNOSIS — M1990 Unspecified osteoarthritis, unspecified site: Secondary | ICD-10-CM

## 2013-02-28 NOTE — Progress Notes (Signed)
  Subjective:    [name], female    DOB: 1960-05-10, 52 y.o.   MRN: [id number]  HPIfoot feels good but if i stand too long it gets stiff Patient presents today ten-day status post medial column fusion first met cuneiform articulation. Patient is air fracture boot intact however is ambulating on the contrary to instructions patient was to maintain nonweightbearing for 6 week.   Review of Systems deferred at this visit     Objective:   Physical Exam Neurovascular status is intact to left foot pedal pulses palpable epicritic sensation intact to all toes left foot. Incision clean dry well coapted. Dressing is removed and Neosporin and a gauze pad is applied. An ankle is dispensed and applied to maintain compression of the foot as there is mild amount of edema and ecchymosis consistent with postop course. Patient is also at this time advised she is to resume using crutches or walker and maintain nonweightbearing for 5 more weeks. No excessive ecchymosis no increased edema noted    Assessment & Plan:  Assessment good postop progress except for daily to maintain nonweightbearing. Patient will resume nonweightbearing status as instructed. May begin bathing and hygiene starting on Friday however will maintain nonweightbearing status. Recheck in 5 weeks for followup x-ray and reevaluation. Contact us with any changes or any increase in pain or symptomology occurs.  Alvan Dame DPM

## 2013-02-28 NOTE — Patient Instructions (Signed)
ICE INSTRUCTIONS  Apply ice or cold pack to the affected area at least 3 times a day for 10-15 minutes each time.  You should also use ice after prolonged activity or vigorous exercise.  Do not apply ice longer than 20 minutes at one time.  Always keep a cloth between your skin and the ice pack to prevent burns.  Being consistent and following these instructions will help control your symptoms.  We suggest you purchase a gel ice pack because they are reusable and do bit leak.  Some of them are designed to wrap around the area.  Use the method that works best for you.  Here are some other suggestions for icing.   Use a frozen bag of peas or corn-inexpensive and molds well to your body, usually stays frozen for 10 to 20 minutes.  Wet a towel with cold water and squeeze out the excess until it's damp.  Place in a bag in the freezer for 20 minutes. Then remove and use.  May begin normal bathing and showers starting Friday, November 7. However must maintain nonweightbearing needs to continue using the boot and crutches at all times for 5 more weeks

## 2013-04-04 ENCOUNTER — Ambulatory Visit (INDEPENDENT_AMBULATORY_CARE_PROVIDER_SITE_OTHER): Payer: Commercial Managed Care - PPO

## 2013-04-04 VITALS — BP 144/82 | HR 62 | Resp 18

## 2013-04-04 DIAGNOSIS — Z09 Encounter for follow-up examination after completed treatment for conditions other than malignant neoplasm: Secondary | ICD-10-CM

## 2013-04-04 DIAGNOSIS — M1990 Unspecified osteoarthritis, unspecified site: Secondary | ICD-10-CM

## 2013-04-04 DIAGNOSIS — M79609 Pain in unspecified limb: Secondary | ICD-10-CM

## 2013-04-04 NOTE — Progress Notes (Signed)
   Subjective:    [name], female    DOB: Apr 24, 1961, 52 y.o.   MRN: [id number]  HPI left foot surgery and it is swollen some and the incision is looking good and was done on 02/18/13 and is a little tender    Review of Systems no changes     Objective:   Physical Exam Neurovascular status intact pedal pulses palpable. Incision well coapted minimal or no pain occasionally a slight paresthesia or twinge. Patient has good range of motion dorsiflexion plantar flexion at the MTP joint of the hallux proximally 50 dorsiflexion 510 plantar flexion no crepitus no pain ambulating with crutches and boot as instructed. X-rays reveal good consolidation of the osteotomy intact screw fixation of the met cuneiform articulation. No hyperostosis exostoses or cyst or identified. No excessive edema noted at this time       Assessment & Plan:  Good postop progress noted in a discontinue crutches at this time 3 weeks from now we'll discontinue the boot and return to come for walking or athletic shoes no barefoot or flimsy shoes or flip-flops. Patient will be able to return to work after her 4 week followup visit it continues to heal well. Good consolidation noted radiographically at this time. Next  Alvan Dame DPM

## 2013-04-04 NOTE — Patient Instructions (Addendum)
ICE INSTRUCTIONS  Apply ice or cold pack to the affected area at least 3 times a day for 10-15 minutes each time.  You should also use ice after prolonged activity or vigorous exercise.  Do not apply ice longer than 20 minutes at one time.  Always keep a cloth between your skin and the ice pack to prevent burns.  Being consistent and following these instructions will help control your symptoms.  We suggest you purchase a gel ice pack because they are reusable and do bit leak.  Some of them are designed to wrap around the area.  Use the method that works best for you.  Here are some other suggestions for icing.   Use a frozen bag of peas or corn-inexpensive and molds well to your body, usually stays frozen for 10 to 20 minutes.  Wet a towel with cold water and squeeze out the excess until it's damp.  Place in a bag in the freezer for 20 minutes. Then remove and use.  May discontinue crutches at this time. Maintain the use of the boot for 3 more weeks. After 3 weeks may discontinue boot and walk in a thick soled athletic or walking shoe or clawing or sandal such as a Dansko or Birkenstock or crocs. Maintain anklet to maintain compression reduce swelling of the foot. Do daily toe exercises moving the toe up and down 5200 times a day to maintain flexibility. Followup in 4 weeks for additional x-rays. After that visit she'll likely be able to return to work activities.

## 2013-05-09 ENCOUNTER — Ambulatory Visit (INDEPENDENT_AMBULATORY_CARE_PROVIDER_SITE_OTHER): Payer: Commercial Managed Care - PPO

## 2013-05-09 VITALS — BP 133/82 | HR 71 | Resp 18

## 2013-05-09 DIAGNOSIS — M1990 Unspecified osteoarthritis, unspecified site: Secondary | ICD-10-CM

## 2013-05-09 DIAGNOSIS — Z09 Encounter for follow-up examination after completed treatment for conditions other than malignant neoplasm: Secondary | ICD-10-CM

## 2013-05-09 DIAGNOSIS — M898X9 Other specified disorders of bone, unspecified site: Secondary | ICD-10-CM

## 2013-05-09 NOTE — Patient Instructions (Signed)
Long-term postop instructions. Patient may continue to get swelling for 3-6 months after surgery elevate use ice and maintain compression stocking as needed or tolerated.  May return to work all regular activities with no restrictions at this time.

## 2013-05-09 NOTE — Progress Notes (Signed)
   Subjective:    Patient ID: Loistine Chanceathy S Life, female    DOB: March 18, 1961, 53 y.o.   MRN: [id number]  HPI my left foot is doing so much better    Review of Systems no new changes or findings     Objective:   Physical Exam Neurovascular status is intact pedal pulses palpable DP and PT +2/4 Refill timed 3-4 seconds epicritic and progressive sensations intact patient is mild varicosities minimal edema noted although slight still present no pain tenderness discomfort ambulating with crocs. Patient has excellent range of motion dorsiflexion plantar flexion at the first MTP joint proximally the us 80-90 dorsiflexion 5-10 plantar flexion. There is no edema no crepitus incision is well coapted x-ray showed good position of the osteotomy and fixation and alignment again old fracture second metatarsal neck noted no symptoms as a result.      Assessment & Plan:  Assessment good postop progress more than 3 months status post Lapidus bunionectomy left foot. Patient ready for discharge from our care at this time maintain appropriate accommodative shoes are all work activities back to normal without restriction discharged for follow care to an as-needed basis  Alvan Dameichard Xeng Kucher DPM

## 2013-07-18 ENCOUNTER — Ambulatory Visit: Payer: Self-pay

## 2013-07-18 ENCOUNTER — Ambulatory Visit (INDEPENDENT_AMBULATORY_CARE_PROVIDER_SITE_OTHER): Payer: Commercial Managed Care - PPO

## 2013-07-18 VITALS — BP 133/81 | HR 62 | Resp 16 | Ht 71.0 in | Wt 211.0 lb

## 2013-07-18 DIAGNOSIS — R609 Edema, unspecified: Secondary | ICD-10-CM

## 2013-07-18 DIAGNOSIS — M79609 Pain in unspecified limb: Secondary | ICD-10-CM

## 2013-07-18 DIAGNOSIS — S93609A Unspecified sprain of unspecified foot, initial encounter: Secondary | ICD-10-CM

## 2013-07-18 DIAGNOSIS — Z9889 Other specified postprocedural states: Secondary | ICD-10-CM

## 2013-07-18 DIAGNOSIS — S92309A Fracture of unspecified metatarsal bone(s), unspecified foot, initial encounter for closed fracture: Secondary | ICD-10-CM

## 2013-07-18 MED ORDER — MELOXICAM 15 MG PO TABS: 15.0000 mg | ORAL_TABLET | Freq: Every day | ORAL | Status: DC

## 2013-07-18 NOTE — Patient Instructions (Signed)

## 2013-07-18 NOTE — Progress Notes (Signed)
   Subjective:    [name], female    DOB: May 26, 1960, 53 y.o.   MRN: [id number] Pt states her surgery foot is sore, and swelling for 3 to 4 weeks, along the lateral left foot. HPI    Review of Systems any changes or findings except for edema lateral left foot    Objective:   Physical Exam Lotion objective findings as follows pedal pulses are palpable DP and PT +2/4 capillary refill timed 3-4 seconds all digits no edema rubor or varicosities on the right left foot has significant edema lateral midfoot along Lisfranc's third fourth and fifth metatarsal base areas. Patient indicates about 3 weeks history of pain at her member that during a snow fall she slipped while shoveling snow and may have jammed return her foot. Since that time has had increasing pain along the lateral column midfoot of her left foot. Patient is status post Lapidus bunionectomy of the same foot however the hallux and medial column is asymptomatic good clinical and radiographic alignment are noted. X-ray views of the foot and ankle taken at this time revealed no signs of fracture dislocation the ankle ankle mortise unremarkable there is some possible slight subluxation Lisfranc for fifth metatarsal base and cuboid articulation on oblique and AP views and cannot rule out possibly a compression fracture of the fourth metatarsal base on oblique and AP view. This would be a nondisplaced metatarsal base fracture no bone callus formation can be identified at this point yet to clinically on palpation there is significant edema and swelling around the fourth metatarsal base and Lisfranc's articulation. There is pain exacerbated any palpation the area or any attempted inversion eversion of the forefoot       Assessment & Plan:  Assessment this time is injured a foot sprain of the midfoot with possible metatarsal fracture the fourth metatarsal base as well as possible capsulitis and sprain of the foot. There is some  significant edema noted in pain and affecting her ability work and ambulate. At this time patient placed in air fracture walker help with the edema and to mobilize the foot and ankle. Will recheck in 4 weeks for followup she should you have review of light-duty work activities no heavy lifting or climbing. Patient will also use MOBIC or meloxicam 15 mg once daily in the interim to help with the pain in symptomology and recommended ice packs intermittently every day. Contact us visiting changes or exacerbations in the interim. Otherwise rex-ray in 4 weeks for followup  Alvan Dameichard Maveryck Bahri DPM

## 2013-08-15 ENCOUNTER — Ambulatory Visit (INDEPENDENT_AMBULATORY_CARE_PROVIDER_SITE_OTHER): Payer: Commercial Managed Care - PPO

## 2013-08-15 VITALS — BP 143/79 | HR 62 | Resp 18

## 2013-08-15 DIAGNOSIS — R609 Edema, unspecified: Secondary | ICD-10-CM

## 2013-08-15 DIAGNOSIS — R52 Pain, unspecified: Secondary | ICD-10-CM

## 2013-08-15 DIAGNOSIS — M7750 Other enthesopathy of unspecified foot: Secondary | ICD-10-CM

## 2013-08-15 DIAGNOSIS — M1990 Unspecified osteoarthritis, unspecified site: Secondary | ICD-10-CM

## 2013-08-15 DIAGNOSIS — M778 Other enthesopathies, not elsewhere classified: Secondary | ICD-10-CM

## 2013-08-15 DIAGNOSIS — M779 Enthesopathy, unspecified: Secondary | ICD-10-CM

## 2013-08-15 DIAGNOSIS — M79609 Pain in unspecified limb: Secondary | ICD-10-CM

## 2013-08-15 NOTE — Progress Notes (Signed)
   Subjective:    [name], female    DOB: [birth year], 53 y.o.   MRN: [id number]  HPI  I am better than I was on my left foot    Review of Systems no new systemic changes or findings noted     Objective:   Physical Exam Lower extremity objective findings pedal pulses are palpable epicritic sensation intact swelling and pain in the left foot is going down she been wearing the air fracture boot for about 2 more weeks. At this time there still some enlargement and swelling along Lisfranc's from first and fifth metatarsal base on the left foot are most the pain was along the fourth and fifth metatarsal base that has resolved no refusal pain or symptomology no pain or crepitus no evidence of possible fracture at this time I do feel is a more than arthropathy flareup that was aggravated by the cold wet weather and has improved with use the NSAIDs for patient did have a reaction to them normal motor meloxicam and made her dizzy area at this time as alternative we'll just use plain ibuprofen or does have Adelard appropriate home she sees anything alternative       Assessment & Plan:  Assessment this time posture arthropathy and capsulitis Lisfranc joint couldn't rule out a stress fracture however more likely arthropathy is it has resolved completely may continue to recur there is bony prominence and spurring and asymmetric joint space narrowing of the is frank joint noted radiographically 1 reviewed patient will maintain a stiff shoe at all times reappointed future and as-needed basis for flareup patient also did have questions about osteoporosis she is taking calcium supplements also suggested vitamin D getting some sunshine vitamin D are important. Also calcium to be beneficial for her osteoporosis. Followup in the future as needed  Alvan Dameichard Ismelda Weatherman DPM

## 2013-08-15 NOTE — Patient Instructions (Signed)
ICE INSTRUCTIONS  Apply ice or cold pack to the affected area at least 3 times a day for 10-15 minutes each time.  You should also use ice after prolonged activity or vigorous exercise.  Do not apply ice longer than 20 minutes at one time.  Always keep a cloth between your skin and the ice pack to prevent burns.  Being consistent and following these instructions will help control your symptoms.  We suggest you purchase a gel ice pack because they are reusable and do bit leak.  Some of them are designed to wrap around the area.  Use the method that works best for you.  Here are some other suggestions for icing.   Use a frozen bag of peas or corn-inexpensive and molds well to your body, usually stays frozen for 10 to 20 minutes.  Wet a towel with cold water and squeeze out the excess until it's damp.  Place in a bag in the freezer for 20 minutes. Then remove and use.lowup on an as-needed basis if the arthritis recur  Maintain the boot this weekend then to discontinue but all will maintain a stiff soled shoe or athletic shoe or boot at all times no barefoot or flimsy shoes or flip-flop. Followup on an as-needed basis if the arthritis recur or exacerbates.

## 2013-11-19 DIAGNOSIS — M722 Plantar fascial fibromatosis: Secondary | ICD-10-CM

## 2014-01-15 ENCOUNTER — Other Ambulatory Visit (HOSPITAL_COMMUNITY): Payer: Commercial Managed Care - PPO

## 2014-01-31 ENCOUNTER — Other Ambulatory Visit: Payer: Self-pay | Admitting: Physician Assistant

## 2014-01-31 NOTE — H&P (Signed)
TOTAL KNEE ADMISSION H&P  Patient is being admitted for right total knee arthroplasty.  Subjective:  Chief Complaint:right knee pain.  HPI: Rhonda [name], 53 y.o. female, has a history of pain and functional disability in the right knee due to arthritis and has failed non-surgical conservative treatments for greater than 12 weeks to includeNSAID's and/or analgesics, corticosteriod injections, viscosupplementation injections and activity modification.  Onset of symptoms was gradual, starting 2 years ago with rapidlly worsening course since that time. The patient noted prior procedures on the knee to include  arthroscopy and menisectomy on the right knee(s).  Patient currently rates pain in the right knee(s) at 5 out of 10 with activity. Patient has night pain and joint swelling.  Patient has evidence of subchondral cysts, subchondral sclerosis and joint space narrowing by imaging studies. There is no active infection.  Patient Active Problem List   Diagnosis Date Noted  . Dizziness 11/29/2010  . Chest pain 11/18/2010  . Murmur 11/18/2010   Past Medical History  Diagnosis Date  . Hypertension   . Reflux   . Osteoarthritis   . Complication of anesthesia   . PONV (postoperative nausea and vomiting)   . GERD (gastroesophageal reflux disease)   . Swelling     standing on feet and legs    Past Surgical History  Procedure Laterality Date  . Total knee arthroplasty  2007    left  . Total abdominal hysterectomy  2011  . Joint replacement    . Foot surgery      left     (Not in a hospital admission) Allergies  Allergen Reactions  . Frovatriptan     History  Substance Use Topics  . Smoking status: Never Smoker   . Smokeless tobacco: Never Used  . Alcohol Use: No    No family history on file.   Review of Systems  Constitutional: Negative.   HENT: Negative.   Eyes: Negative.   Respiratory: Negative.   Cardiovascular: Negative.   Gastrointestinal: Negative.    Genitourinary: Negative.   Musculoskeletal: Positive for joint pain.  Skin: Negative.   Neurological: Negative.   Endo/Heme/Allergies: Negative.   Psychiatric/Behavioral: Negative.     Objective:  Physical Exam  Constitutional: She is oriented to person, place, and time. She appears well-developed and well-nourished.  HENT:  Head: Normocephalic and atraumatic.  Eyes: EOM are normal. Pupils are equal, round, and reactive to light.  Neck: Normal range of motion. Neck supple.  Cardiovascular: Normal rate and regular rhythm.  Exam reveals no gallop and no friction rub.   No murmur heard. Respiratory: Effort normal and breath sounds normal.  GI: Soft. Bowel sounds are normal.  Musculoskeletal:  Examination of the right knee reveals range of motion from 0 to 125 degrees with profound patellofemoral crepitus.  Tenderness to palpation lateral joint line.  Moderately increased Q-angle with lateral tracking and tethering.  No apprehension.  Negative log roll and negative straight leg raise.  She is neurovascularly intact distally.    Neurological: She is alert and oriented to person, place, and time.  Skin: Skin is warm and dry.  Psychiatric: She has a normal mood and affect. Her behavior is normal. Judgment and thought content normal.    Vital signs in last 24 hours: @VSRANGES @  Labs:   Estimated body mass index is 29.44 kg/(m^2) as calculated from the following:   Height as of 07/18/13: 5\' 11"  (1.803 m).   Weight as of 07/18/13: 95.709 kg (211 lb).   Imaging  Review Plain radiographs demonstrate severe degenerative joint disease of the right knee(s). The overall alignment ismild valgus. The bone quality appears to be fair for age and reported activity level.  Assessment/Plan:  End stage arthritis, right knee   The patient history, physical examination, clinical judgment of the provider and imaging studies are consistent with end stage degenerative joint disease of the right  knee(s) and total knee arthroplasty is deemed medically necessary. The treatment options including medical management, injection therapy arthroscopy and arthroplasty were discussed at length. The risks and benefits of total knee arthroplasty were presented and reviewed. The risks due to aseptic loosening, infection, stiffness, patella tracking problems, thromboembolic complications and other imponderables were discussed. The patient acknowledged the explanation, agreed to proceed with the plan and consent was signed. Patient is being admitted for inpatient treatment for surgery, pain control, PT, OT, prophylactic antibiotics, VTE prophylaxis, progressive ambulation and ADL's and discharge planning. The patient is planning to be discharged home with home health services

## 2014-02-05 ENCOUNTER — Encounter (HOSPITAL_COMMUNITY): Payer: Self-pay | Admitting: Pharmacy Technician

## 2014-02-08 NOTE — Pre-Procedure Instructions (Signed)
Loistine ChanceCathy S Nelson  02/08/2014   Your procedure is scheduled on:  October 28  [facility]mitting at 11:00 AM.  Call this number if you have problems the morning of surgery: [phone]   Remember:   Do not eat food or drink liquids after midnight.   Take these medicines the morning of surgery with A SIP OF WATER: Albuterol, Metoprolol, Omeprazole, Ranitidine, Albuterol (if needed)   STOP Calcium October 21   STOP/ Do not take Aspirin, Aleve, Naproxen, Advil, Ibuprofen, Motrin, Vitamins, Herbs, or Supplements starting October 21   Do not wear jewelry, make-up or nail polish.  Do not wear lotions, powders, or perfumes. You may wear deodorant.  Do not shave 48 hours prior to surgery. Men may shave face and neck.  Do not bring valuables to the hospital.  [facility] is not responsible for any belongings or valuables.               Contacts, dentures or bridgework may not be worn into surgery.  Leave suitcase in the car. After surgery it may be brought to your room.  For patients admitted to the hospital, discharge time is determined by your treatment team.               Special Instructions: [facility] - Preparing for Surgery  Before surgery, you can play an important role.  Because skin is not sterile, your skin needs to be as free of germs as possible.  You can reduce the number of germs on you skin by washing with CHG (chlorahexidine gluconate) soap before surgery.  CHG is an antiseptic cleaner which kills germs and bonds with the skin to continue killing germs even after washing.  Please DO NOT use if you have an allergy to CHG or antibacterial soaps.  If your skin becomes reddened/irritated stop using the CHG and inform your nurse when you arrive at Short Stay.  Do not shave (including legs and underarms) for at least 48 hours prior to the first CHG shower.  You may shave your face.  Please follow these instructions carefully:   1.  Shower with CHG Soap  the night before surgery and the morning of Surgery.  2.  If you choose to wash your hair, wash your hair first as usual with your normal shampoo.  3.  After you shampoo, rinse your hair and body thoroughly to remove the shampoo.  4.  Use CHG as you would any other liquid soap.  You can apply CHG directly to the skin and wash gently with scrungie or a clean washcloth.  5.  Apply the CHG Soap to your body ONLY FROM THE NECK DOWN.  Do not use on open wounds or open sores.  Avoid contact with your eyes, ears, mouth and genitals (private parts).  Wash genitals (private parts) with your normal soap.  6.  Wash thoroughly, paying special attention to the area where your surgery will be performed.  7.  Thoroughly rinse your body with warm water from the neck down.  8.  DO NOT shower/wash with your normal soap after using and rinsing off the CHG Soap.  9.  Pat yourself dry with a clean towel.            10.  Wear clean pajamas.            11.  Place clean sheets on your bed the night of your first shower and do not sleep with pets.  Day of Surgery  Do not apply any lotions the morning of surgery.  Please wear clean clothes to the hospital/surgery center.     Please read over the following fact sheets that you were given: Pain Booklet, Coughing and Deep Breathing, Blood Transfusion Information, Total Joint Packet and Surgical Site Infection Prevention

## 2014-02-10 ENCOUNTER — Encounter (HOSPITAL_COMMUNITY): Payer: Self-pay

## 2014-02-10 ENCOUNTER — Encounter (HOSPITAL_COMMUNITY)
Admission: RE | Admit: 2014-02-10 | Discharge: 2014-02-10 | Disposition: A | Discharge status: Home / Self Care | Payer: Commercial Managed Care - PPO | Source: Ambulatory Visit | Attending: Orthopedic Surgery | Admitting: Orthopedic Surgery

## 2014-02-10 ENCOUNTER — Encounter (HOSPITAL_COMMUNITY)
Admission: RE | Admit: 2014-02-10 | Discharge: 2014-02-10 | Disposition: A | Discharge status: Home / Self Care | Payer: Commercial Managed Care - PPO | Source: Ambulatory Visit | Attending: Physician Assistant | Admitting: Physician Assistant

## 2014-02-10 DIAGNOSIS — M179 Osteoarthritis of knee, unspecified: Secondary | ICD-10-CM | POA: Diagnosis not present

## 2014-02-10 DIAGNOSIS — I10 Essential (primary) hypertension: Secondary | ICD-10-CM | POA: Insufficient documentation

## 2014-02-10 DIAGNOSIS — Z01818 Encounter for other preprocedural examination: Secondary | ICD-10-CM | POA: Diagnosis not present

## 2014-02-10 HISTORY — DX: Unspecified asthma, uncomplicated: J45.909

## 2014-02-10 HISTORY — DX: Anxiety disorder, unspecified: F41.9

## 2014-02-10 LAB — CBC WITH DIFFERENTIAL/PLATELET
BASOS ABS: 0 10*3/uL (ref 0.0–0.1)
BASOS PCT: 0 % (ref 0–1)
Eosinophils Absolute: 0.1 10*3/uL (ref 0.0–0.7)
Eosinophils Relative: 2 % (ref 0–5)
HEMATOCRIT: 36.5 % (ref 36.0–46.0)
Hemoglobin: 12.8 g/dL (ref 12.0–15.0)
LYMPHS PCT: 22 % (ref 12–46)
Lymphs Abs: 1.6 10*3/uL (ref 0.7–4.0)
MCH: 30.2 pg (ref 26.0–34.0)
MCHC: 35.1 g/dL (ref 30.0–36.0)
MCV: 86.1 fL (ref 78.0–100.0)
Monocytes Absolute: 0.4 10*3/uL (ref 0.1–1.0)
Monocytes Relative: 6 % (ref 3–12)
NEUTROS ABS: 5.1 10*3/uL (ref 1.7–7.7)
NEUTROS PCT: 70 % (ref 43–77)
PLATELETS: 190 10*3/uL (ref 150–400)
RBC: 4.24 MIL/uL (ref 3.87–5.11)
RDW: 12.1 % (ref 11.5–15.5)
WBC: 7.3 10*3/uL (ref 4.0–10.5)

## 2014-02-10 LAB — COMPREHENSIVE METABOLIC PANEL
ALBUMIN: 4.2 g/dL (ref 3.5–5.2)
ALT: 15 U/L (ref 0–35)
AST: 18 U/L (ref 0–37)
Alkaline Phosphatase: 134 U/L — ABNORMAL HIGH (ref 39–117)
Anion gap: 13 (ref 5–15)
BILIRUBIN TOTAL: 0.2 mg/dL — AB (ref 0.3–1.2)
BUN: 14 mg/dL (ref 6–23)
CHLORIDE: 102 meq/L (ref 96–112)
CO2: 26 meq/L (ref 19–32)
Calcium: 9.8 mg/dL (ref 8.4–10.5)
Creatinine, Ser: 0.77 mg/dL (ref 0.50–1.10)
GFR calc Af Amer: >90 mL/min (ref >90)
Glucose, Bld: 103 mg/dL — ABNORMAL HIGH (ref 70–99)
POTASSIUM: 4.4 meq/L (ref 3.7–5.3)
SODIUM: 141 meq/L (ref 137–147)
Total Protein: 7.4 g/dL (ref 6.0–8.3)

## 2014-02-10 LAB — URINALYSIS, ROUTINE W REFLEX MICROSCOPIC
BILIRUBIN URINE: NEGATIVE
Glucose, UA: NEGATIVE mg/dL
KETONES UR: NEGATIVE mg/dL
Leukocytes, UA: NEGATIVE
Nitrite: NEGATIVE
PH: 5 (ref 5.0–8.0)
Protein, ur: NEGATIVE mg/dL
SPECIFIC GRAVITY, URINE: 1.017 (ref 1.005–1.030)
UROBILINOGEN UA: 0.2 mg/dL (ref 0.0–1.0)

## 2014-02-10 LAB — URINE MICROSCOPIC-ADD ON

## 2014-02-10 LAB — SURGICAL PCR SCREEN
MRSA, PCR: NEGATIVE
Staphylococcus aureus: POSITIVE — AB

## 2014-02-10 LAB — PROTIME-INR
INR: 1.08 (ref 0.00–1.49)
PROTHROMBIN TIME: 14.1 s (ref 11.6–15.2)

## 2014-02-10 LAB — APTT: APTT: 32 s (ref 24–37)

## 2014-02-10 LAB — TYPE AND SCREEN
ABO/RH(D): O POS
ANTIBODY SCREEN: NEGATIVE

## 2014-02-10 NOTE — Progress Notes (Signed)
EKG,OV requested from [facility] [place].

## 2014-02-10 NOTE — Progress Notes (Signed)
Mupirocin ointment Rx called into [facility] on [address] in [place] for positive PCR of staph. Pt notified and voiced understanding.

## 2014-02-11 LAB — URINE CULTURE
COLONY COUNT: NO GROWTH
Culture: NO GROWTH

## 2014-02-18 MED ORDER — CEFAZOLIN SODIUM-DEXTROSE 2-3 GM-% IV SOLR
2.0000 g | INTRAVENOUS | Status: AC
  Administered 2014-02-19: 2 g via INTRAVENOUS
  Filled 2014-02-18: qty 50

## 2014-02-18 MED ORDER — LACTATED RINGERS IV SOLN
INTRAVENOUS | Status: DC
  Administered 2014-02-19: 10:00:00 via INTRAVENOUS

## 2014-02-19 ENCOUNTER — Encounter (HOSPITAL_COMMUNITY): Payer: Commercial Managed Care - PPO | Admitting: Anesthesiology

## 2014-02-19 ENCOUNTER — Encounter (HOSPITAL_COMMUNITY): Payer: Self-pay | Admitting: Anesthesiology

## 2014-02-19 ENCOUNTER — Inpatient Hospital Stay (HOSPITAL_COMMUNITY): Payer: Commercial Managed Care - PPO | Admitting: Anesthesiology

## 2014-02-19 ENCOUNTER — Inpatient Hospital Stay (HOSPITAL_COMMUNITY)
Admission: RE | Admit: 2014-02-19 | Discharge: 2014-02-21 | DRG: 470 | Disposition: A | Discharge status: Home Health | Payer: Commercial Managed Care - PPO | Source: Ambulatory Visit | Attending: Orthopedic Surgery | Admitting: Orthopedic Surgery

## 2014-02-19 ENCOUNTER — Encounter (HOSPITAL_COMMUNITY)
Admission: RE | Disposition: A | Discharge status: Home Health | Payer: Self-pay | Source: Ambulatory Visit | Attending: Orthopedic Surgery

## 2014-02-19 ENCOUNTER — Inpatient Hospital Stay (HOSPITAL_COMMUNITY): Payer: Commercial Managed Care - PPO

## 2014-02-19 DIAGNOSIS — D62 Acute posthemorrhagic anemia: Secondary | ICD-10-CM | POA: Diagnosis not present

## 2014-02-19 DIAGNOSIS — K219 Gastro-esophageal reflux disease without esophagitis: Secondary | ICD-10-CM | POA: Diagnosis present

## 2014-02-19 DIAGNOSIS — M1710 Unilateral primary osteoarthritis, unspecified knee: Secondary | ICD-10-CM | POA: Diagnosis present

## 2014-02-19 DIAGNOSIS — Z7982 Long term (current) use of aspirin: Secondary | ICD-10-CM | POA: Diagnosis not present

## 2014-02-19 DIAGNOSIS — M25561 Pain in right knee: Secondary | ICD-10-CM | POA: Diagnosis present

## 2014-02-19 DIAGNOSIS — M1711 Unilateral primary osteoarthritis, right knee: Principal | ICD-10-CM | POA: Diagnosis present

## 2014-02-19 DIAGNOSIS — I10 Essential (primary) hypertension: Secondary | ICD-10-CM | POA: Diagnosis present

## 2014-02-19 DIAGNOSIS — Z96652 Presence of left artificial knee joint: Secondary | ICD-10-CM | POA: Diagnosis present

## 2014-02-19 DIAGNOSIS — Z79899 Other long term (current) drug therapy: Secondary | ICD-10-CM | POA: Diagnosis not present

## 2014-02-19 DIAGNOSIS — M179 Osteoarthritis of knee, unspecified: Secondary | ICD-10-CM | POA: Diagnosis present

## 2014-02-19 DIAGNOSIS — Z96659 Presence of unspecified artificial knee joint: Secondary | ICD-10-CM

## 2014-02-19 DIAGNOSIS — J45909 Unspecified asthma, uncomplicated: Secondary | ICD-10-CM | POA: Diagnosis present

## 2014-02-19 HISTORY — PX: TOTAL KNEE ARTHROPLASTY: SHX125

## 2014-02-19 SURGERY — ARTHROPLASTY, KNEE, TOTAL
Anesthesia: Monitor Anesthesia Care | Laterality: Right

## 2014-02-19 MED ORDER — CHLORHEXIDINE GLUCONATE 4 % EX LIQD: 60.0000 mL | Freq: Once | CUTANEOUS | Status: DC

## 2014-02-19 MED ORDER — ACETAMINOPHEN 650 MG RE SUPP: 650.0000 mg | Freq: Four times a day (QID) | RECTAL | Status: DC | PRN

## 2014-02-19 MED ORDER — OXYCODONE HCL 5 MG PO TABS: 5.0000 mg | ORAL_TABLET | Freq: Once | ORAL | Status: DC | PRN

## 2014-02-19 MED ORDER — HYDROMORPHONE HCL 1 MG/ML IJ SOLN: 0.2500 mg | INTRAMUSCULAR | Status: DC | PRN

## 2014-02-19 MED ORDER — BISACODYL 5 MG PO TBEC: 5.0000 mg | DELAYED_RELEASE_TABLET | Freq: Every day | ORAL | Status: DC | PRN

## 2014-02-19 MED ORDER — POTASSIUM CHLORIDE IN NACL 20-0.9 MEQ/L-% IV SOLN
INTRAVENOUS | Status: DC
  Administered 2014-02-19: 23:00:00 via INTRAVENOUS
  Filled 2014-02-19 (×3): qty 1000

## 2014-02-19 MED ORDER — METHOCARBAMOL 1000 MG/10ML IJ SOLN: 500.0000 mg | Freq: Four times a day (QID) | INTRAVENOUS | Status: DC | PRN

## 2014-02-19 MED ORDER — BUPIVACAINE LIPOSOME 1.3 % IJ SUSP
INTRAMUSCULAR | Status: DC | PRN
  Administered 2014-02-19: 20 mL

## 2014-02-19 MED ORDER — ASPIRIN EC 325 MG PO TBEC: 325.0000 mg | DELAYED_RELEASE_TABLET | Freq: Every day | ORAL | Status: AC

## 2014-02-19 MED ORDER — BUPIVACAINE HCL (PF) 0.25 % IJ SOLN
INTRAMUSCULAR | Status: DC | PRN
  Administered 2014-02-19: 10 mL

## 2014-02-19 MED ORDER — GLYCOPYRROLATE 0.2 MG/ML IJ SOLN
INTRAMUSCULAR | Status: AC
  Filled 2014-02-19: qty 2

## 2014-02-19 MED ORDER — ONDANSETRON HCL 4 MG/2ML IJ SOLN
INTRAMUSCULAR | Status: AC
  Filled 2014-02-19: qty 2

## 2014-02-19 MED ORDER — ARTIFICIAL TEARS OP OINT
TOPICAL_OINTMENT | OPHTHALMIC | Status: AC
  Filled 2014-02-19: qty 3.5

## 2014-02-19 MED ORDER — HYDROMORPHONE HCL 1 MG/ML IJ SOLN
0.5000 mg | INTRAMUSCULAR | Status: DC | PRN
  Administered 2014-02-19: 1 mg via INTRAVENOUS
  Filled 2014-02-19: qty 1

## 2014-02-19 MED ORDER — DEXAMETHASONE SODIUM PHOSPHATE 4 MG/ML IJ SOLN
INTRAMUSCULAR | Status: DC | PRN
  Administered 2014-02-19: 4 mg via INTRAVENOUS

## 2014-02-19 MED ORDER — PROPOFOL INFUSION 10 MG/ML OPTIME
INTRAVENOUS | Status: DC | PRN
  Administered 2014-02-19: 75 ug/kg/min via INTRAVENOUS

## 2014-02-19 MED ORDER — LACTATED RINGERS IV SOLN
INTRAVENOUS | Status: DC | PRN
  Administered 2014-02-19 (×2): via INTRAVENOUS

## 2014-02-19 MED ORDER — CHLORHEXIDINE GLUCONATE 4 % EX LIQD
60.0000 mL | Freq: Once | CUTANEOUS | Status: DC
  Filled 2014-02-19: qty 60

## 2014-02-19 MED ORDER — DIPHENHYDRAMINE HCL 12.5 MG/5ML PO ELIX
12.5000 mg | ORAL_SOLUTION | ORAL | Status: DC | PRN
Start: 2014-02-19 — End: 2014-02-21

## 2014-02-19 MED ORDER — PROPOFOL 10 MG/ML IV BOLUS
INTRAVENOUS | Status: AC
  Filled 2014-02-19: qty 20

## 2014-02-19 MED ORDER — FENTANYL CITRATE 0.05 MG/ML IJ SOLN
INTRAMUSCULAR | Status: AC
  Filled 2014-02-19: qty 5

## 2014-02-19 MED ORDER — FAMOTIDINE 40 MG PO TABS
40.0000 mg | ORAL_TABLET | Freq: Every day | ORAL | Status: DC
  Administered 2014-02-19 – 2014-02-20 (×2): 40 mg via ORAL
  Filled 2014-02-19 (×3): qty 1

## 2014-02-19 MED ORDER — OXYCODONE-ACETAMINOPHEN 5-325 MG PO TABS: 1.0000 | ORAL_TABLET | ORAL | Status: AC | PRN

## 2014-02-19 MED ORDER — ACETAMINOPHEN 325 MG PO TABS
650.0000 mg | ORAL_TABLET | Freq: Four times a day (QID) | ORAL | Status: DC | PRN
  Administered 2014-02-20 – 2014-02-21 (×4): 650 mg via ORAL
  Filled 2014-02-19 (×4): qty 2

## 2014-02-19 MED ORDER — METHOCARBAMOL 500 MG PO TABS: 500.0000 mg | ORAL_TABLET | Freq: Four times a day (QID) | ORAL | Status: DC | PRN

## 2014-02-19 MED ORDER — OXYCODONE HCL 5 MG/5ML PO SOLN: 5.0000 mg | Freq: Once | ORAL | Status: DC | PRN

## 2014-02-19 MED ORDER — ROCURONIUM BROMIDE 50 MG/5ML IV SOLN
INTRAVENOUS | Status: AC
  Filled 2014-02-19: qty 1

## 2014-02-19 MED ORDER — FENTANYL CITRATE 0.05 MG/ML IJ SOLN
INTRAMUSCULAR | Status: DC | PRN
  Administered 2014-02-19: 100 ug via INTRAVENOUS

## 2014-02-19 MED ORDER — SODIUM CHLORIDE 0.9 % IJ SOLN
INTRAMUSCULAR | Status: DC | PRN
  Administered 2014-02-19: 40 mL

## 2014-02-19 MED ORDER — ESTROGENS CONJUGATED 0.3 MG PO TABS
0.3000 mg | ORAL_TABLET | Freq: Every day | ORAL | Status: DC
  Administered 2014-02-20 – 2014-02-21 (×2): 0.3 mg via ORAL
  Filled 2014-02-19 (×2): qty 1

## 2014-02-19 MED ORDER — BUPIVACAINE LIPOSOME 1.3 % IJ SUSP
20.0000 mL | Freq: Once | INTRAMUSCULAR | Status: DC
  Filled 2014-02-19: qty 20

## 2014-02-19 MED ORDER — BUPIVACAINE HCL (PF) 0.25 % IJ SOLN
INTRAMUSCULAR | Status: AC
  Filled 2014-02-19: qty 30

## 2014-02-19 MED ORDER — PANTOPRAZOLE SODIUM 40 MG PO TBEC
40.0000 mg | DELAYED_RELEASE_TABLET | Freq: Every day | ORAL | Status: DC
  Administered 2014-02-19 – 2014-02-21 (×3): 40 mg via ORAL
  Filled 2014-02-19 (×2): qty 1

## 2014-02-19 MED ORDER — MIDAZOLAM HCL 5 MG/5ML IJ SOLN
INTRAMUSCULAR | Status: DC | PRN
  Administered 2014-02-19: 2 mg via INTRAVENOUS

## 2014-02-19 MED ORDER — CELECOXIB 200 MG PO CAPS
200.0000 mg | ORAL_CAPSULE | Freq: Two times a day (BID) | ORAL | Status: DC
  Administered 2014-02-19 – 2014-02-21 (×4): 200 mg via ORAL
  Filled 2014-02-19 (×5): qty 1

## 2014-02-19 MED ORDER — OXYCODONE HCL 5 MG PO TABS
5.0000 mg | ORAL_TABLET | ORAL | Status: DC | PRN
  Administered 2014-02-19: 10 mg via ORAL
  Administered 2014-02-19: 5 mg via ORAL
  Administered 2014-02-20 (×2): 10 mg via ORAL
  Filled 2014-02-19: qty 1
  Filled 2014-02-19 (×3): qty 2

## 2014-02-19 MED ORDER — ONDANSETRON HCL 4 MG/2ML IJ SOLN
4.0000 mg | Freq: Four times a day (QID) | INTRAMUSCULAR | Status: DC | PRN
  Administered 2014-02-19 – 2014-02-20 (×2): 4 mg via INTRAVENOUS
  Filled 2014-02-19 (×2): qty 2

## 2014-02-19 MED ORDER — METOPROLOL TARTRATE 50 MG PO TABS
50.0000 mg | ORAL_TABLET | Freq: Every day | ORAL | Status: DC
  Administered 2014-02-19 – 2014-02-20 (×2): 50 mg via ORAL
  Filled 2014-02-19 (×3): qty 1

## 2014-02-19 MED ORDER — ASPIRIN EC 325 MG PO TBEC
325.0000 mg | DELAYED_RELEASE_TABLET | Freq: Every day | ORAL | Status: DC
  Administered 2014-02-20 – 2014-02-21 (×2): 325 mg via ORAL
  Filled 2014-02-19 (×3): qty 1

## 2014-02-19 MED ORDER — CEFAZOLIN SODIUM-DEXTROSE 2-3 GM-% IV SOLR
2.0000 g | Freq: Four times a day (QID) | INTRAVENOUS | Status: AC
  Administered 2014-02-19 (×2): 2 g via INTRAVENOUS
  Filled 2014-02-19 (×2): qty 50

## 2014-02-19 MED ORDER — BISACODYL 5 MG PO TBEC: 5.0000 mg | DELAYED_RELEASE_TABLET | Freq: Every day | ORAL | Status: AC | PRN

## 2014-02-19 MED ORDER — METOCLOPRAMIDE HCL 5 MG/ML IJ SOLN: 5.0000 mg | Freq: Three times a day (TID) | INTRAMUSCULAR | Status: DC | PRN

## 2014-02-19 MED ORDER — SODIUM CHLORIDE 0.9 % IR SOLN
Status: DC | PRN
  Administered 2014-02-19: 3000 mL

## 2014-02-19 MED ORDER — ONDANSETRON HCL 4 MG PO TABS: 4.0000 mg | ORAL_TABLET | Freq: Three times a day (TID) | ORAL | Status: AC | PRN

## 2014-02-19 MED ORDER — DOCUSATE SODIUM 100 MG PO CAPS
100.0000 mg | ORAL_CAPSULE | Freq: Two times a day (BID) | ORAL | Status: DC
  Administered 2014-02-19 – 2014-02-21 (×4): 100 mg via ORAL
  Filled 2014-02-19 (×5): qty 1

## 2014-02-19 MED ORDER — PHENOL 1.4 % MT LIQD: 1.0000 | OROMUCOSAL | Status: DC | PRN

## 2014-02-19 MED ORDER — MENTHOL 3 MG MT LOZG: 1.0000 | LOZENGE | OROMUCOSAL | Status: DC | PRN

## 2014-02-19 MED ORDER — ONDANSETRON HCL 4 MG PO TABS
4.0000 mg | ORAL_TABLET | Freq: Four times a day (QID) | ORAL | Status: DC | PRN
  Administered 2014-02-20: 4 mg via ORAL
  Filled 2014-02-19: qty 1

## 2014-02-19 MED ORDER — MIDAZOLAM HCL 2 MG/2ML IJ SOLN
INTRAMUSCULAR | Status: AC
  Filled 2014-02-19: qty 2

## 2014-02-19 MED ORDER — SCOPOLAMINE 1 MG/3DAYS TD PT72
1.0000 | MEDICATED_PATCH | Freq: Once | TRANSDERMAL | Status: DC
  Administered 2014-02-19: 1.5 mg via TRANSDERMAL
  Filled 2014-02-19: qty 1

## 2014-02-19 MED ORDER — BUPIVACAINE HCL (PF) 0.75 % IJ SOLN
INTRAMUSCULAR | Status: DC | PRN
  Administered 2014-02-19: 15 mg via INTRATHECAL

## 2014-02-19 MED ORDER — ALBUTEROL SULFATE HFA 108 (90 BASE) MCG/ACT IN AERS: 2.0000 | INHALATION_SPRAY | RESPIRATORY_TRACT | Status: DC | PRN

## 2014-02-19 MED ORDER — METHOCARBAMOL 500 MG PO TABS: 500.0000 mg | ORAL_TABLET | Freq: Four times a day (QID) | ORAL | Status: AC

## 2014-02-19 MED ORDER — METOCLOPRAMIDE HCL 10 MG PO TABS
5.0000 mg | ORAL_TABLET | Freq: Three times a day (TID) | ORAL | Status: DC | PRN
Start: 2014-02-19 — End: 2014-02-21

## 2014-02-19 MED ORDER — ONDANSETRON HCL 4 MG/2ML IJ SOLN
INTRAMUSCULAR | Status: DC | PRN
  Administered 2014-02-19: 4 mg via INTRAVENOUS

## 2014-02-19 MED ORDER — NEOSTIGMINE METHYLSULFATE 10 MG/10ML IV SOLN
INTRAVENOUS | Status: AC
  Filled 2014-02-19: qty 1

## 2014-02-19 SURGICAL SUPPLY — 69 items
APL SKNCLS STERI-STRIP NONHPOA (GAUZE/BANDAGES/DRESSINGS) ×1
BANDAGE ELASTIC 4 VELCRO ST LF (GAUZE/BANDAGES/DRESSINGS) ×3 IMPLANT
BANDAGE ELASTIC 6 VELCRO ST LF (GAUZE/BANDAGES/DRESSINGS) ×3 IMPLANT
BANDAGE ESMARK 6X9 LF (GAUZE/BANDAGES/DRESSINGS) ×1 IMPLANT
BENZOIN TINCTURE PRP APPL 2/3 (GAUZE/BANDAGES/DRESSINGS) ×3 IMPLANT
BLADE SAG 18X100X1.27 (BLADE) ×6 IMPLANT
BNDG CMPR 9X6 STRL LF SNTH (GAUZE/BANDAGES/DRESSINGS) ×1
BNDG ESMARK 6X9 LF (GAUZE/BANDAGES/DRESSINGS) ×3
BOWL SMART MIX CTS (DISPOSABLE) ×3 IMPLANT
CEMENT BONE SIMPLEX SPEEDSET (Cement) ×6 IMPLANT
CLOSURE STERI-STRIP 1/2X4 (GAUZE/BANDAGES/DRESSINGS) ×1
CLOSURE WOUND 1/2 X4 (GAUZE/BANDAGES/DRESSINGS) ×2
CLSR STERI-STRIP ANTIMIC 1/2X4 (GAUZE/BANDAGES/DRESSINGS) ×1 IMPLANT
COVER SURGICAL LIGHT HANDLE (MISCELLANEOUS) ×3 IMPLANT
CUFF TOURNIQUET SINGLE 34IN LL (TOURNIQUET CUFF) ×3 IMPLANT
DRAPE EXTREMITY T 121X128X90 (DRAPE) ×3 IMPLANT
DRAPE PROXIMA HALF (DRAPES) ×3 IMPLANT
DRAPE U-SHAPE 47X51 STRL (DRAPES) ×3 IMPLANT
DRSG PAD ABDOMINAL 8X10 ST (GAUZE/BANDAGES/DRESSINGS) ×3 IMPLANT
DURAPREP 26ML APPLICATOR (WOUND CARE) ×6 IMPLANT
ELECT CAUTERY BLADE 6.4 (BLADE) ×3 IMPLANT
ELECT REM PT RETURN 9FT ADLT (ELECTROSURGICAL) ×3
ELECTRODE REM PT RTRN 9FT ADLT (ELECTROSURGICAL) ×1 IMPLANT
EVACUATOR 1/8 PVC DRAIN (DRAIN) ×3 IMPLANT
FACESHIELD WRAPAROUND (MASK) ×6 IMPLANT
FACESHIELD WRAPAROUND OR TEAM (MASK) ×2 IMPLANT
GAUZE SPONGE 4X4 12PLY STRL (GAUZE/BANDAGES/DRESSINGS) ×3 IMPLANT
GLOVE BIOGEL PI IND STRL 7.0 (GLOVE) ×2 IMPLANT
GLOVE BIOGEL PI INDICATOR 7.0 (GLOVE) ×4
GLOVE ECLIPSE 6.5 STRL STRAW (GLOVE) ×6 IMPLANT
GLOVE ORTHO TXT STRL SZ7.5 (GLOVE) ×3 IMPLANT
GOWN STRL REUS W/ TWL LRG LVL3 (GOWN DISPOSABLE) ×1 IMPLANT
GOWN STRL REUS W/ TWL XL LVL3 (GOWN DISPOSABLE) ×1 IMPLANT
GOWN STRL REUS W/TWL LRG LVL3 (GOWN DISPOSABLE) ×3
GOWN STRL REUS W/TWL XL LVL3 (GOWN DISPOSABLE) ×6
HANDPIECE INTERPULSE COAX TIP (DISPOSABLE) ×3
IMMOBILIZER KNEE 22 UNIV (SOFTGOODS) ×3 IMPLANT
IMMOBILIZER KNEE 24 THIGH 36 (MISCELLANEOUS) IMPLANT
IMMOBILIZER KNEE 24 UNIV (MISCELLANEOUS)
KIT BASIN OR (CUSTOM PROCEDURE TRAY) ×3 IMPLANT
KIT ROOM TURNOVER OR (KITS) ×3 IMPLANT
KNEE/VIT E POLY LINER LEVEL 1B ×2 IMPLANT
MANIFOLD NEPTUNE II (INSTRUMENTS) ×3 IMPLANT
NDL 18GX1X1/2 (RX/OR ONLY) (NEEDLE) ×1 IMPLANT
NDL 25GX 5/8IN NON SAFETY (NEEDLE) ×1 IMPLANT
NEEDLE 18GX1X1/2 (RX/OR ONLY) (NEEDLE) ×3 IMPLANT
NEEDLE 25GX 5/8IN NON SAFETY (NEEDLE) ×3 IMPLANT
NS IRRIG 1000ML POUR BTL (IV SOLUTION) ×3 IMPLANT
PACK TOTAL JOINT (CUSTOM PROCEDURE TRAY) ×3 IMPLANT
PAD ARMBOARD 7.5X6 YLW CONV (MISCELLANEOUS) ×6 IMPLANT
PAD CAST 4YDX4 CTTN HI CHSV (CAST SUPPLIES) ×1 IMPLANT
PADDING CAST COTTON 4X4 STRL (CAST SUPPLIES) ×3
PADDING CAST COTTON 6X4 STRL (CAST SUPPLIES) ×3 IMPLANT
SET HNDPC FAN SPRY TIP SCT (DISPOSABLE) ×1 IMPLANT
SPONGE GAUZE 4X4 12PLY STER LF (GAUZE/BANDAGES/DRESSINGS) ×2 IMPLANT
STRIP CLOSURE SKIN 1/2X4 (GAUZE/BANDAGES/DRESSINGS) ×4 IMPLANT
SUCTION FRAZIER TIP 10 FR DISP (SUCTIONS) ×3 IMPLANT
SUT MNCRL AB 4-0 PS2 18 (SUTURE) ×3 IMPLANT
SUT VIC AB 0 CT1 27 (SUTURE) ×3
SUT VIC AB 0 CT1 27XBRD ANBCTR (SUTURE) IMPLANT
SUT VIC AB 1 CT1 27 (SUTURE) ×9
SUT VIC AB 1 CT1 27XBRD ANBCTR (SUTURE) ×2 IMPLANT
SUT VIC AB 2-0 CT1 27 (SUTURE) ×9
SUT VIC AB 2-0 CT1 TAPERPNT 27 (SUTURE) ×2 IMPLANT
SYR 50ML LL SCALE MARK (SYRINGE) ×3 IMPLANT
SYR CONTROL 10ML LL (SYRINGE) ×3 IMPLANT
TOWEL OR 17X24 6PK STRL BLUE (TOWEL DISPOSABLE) ×3 IMPLANT
TOWEL OR 17X26 10 PK STRL BLUE (TOWEL DISPOSABLE) ×3 IMPLANT
WATER STERILE IRR 1000ML POUR (IV SOLUTION) ×2 IMPLANT

## 2014-02-19 NOTE — Transfer of Care (Signed)
Immediate Anesthesia Transfer of Care Note  Patient: Rhonda [name]  Procedure(s) Performed: Procedure(s): RIGHT TOTAL KNEE ARTHROPLASTY (Right)  Patient Location: PACU  Anesthesia Type:MAC combined with regional for post-op pain  Level of Consciousness: awake, alert  and oriented  Airway & Oxygen Therapy: Patient Spontanous Breathing and Patient connected to face mask oxygen  Post-op Assessment: Report given to PACU RN  Post vital signs: Reviewed and stable  Complications: No apparent anesthesia complications

## 2014-02-19 NOTE — Progress Notes (Signed)
Orthopedic Tech Progress Note Patient Details:  Rhonda [name] 05-22-1960 [id number] Applied CPM to RLE.  Applied OHF with trapeze to pt.'s bed.   CPM Right Knee Right Knee Flexion (Degrees): 90 Right Knee Extension (Degrees): 0   Rhonda [name], Rhonda [name] 02/19/2014, 2:21 PM

## 2014-02-19 NOTE — Discharge Summary (Addendum)
[name] MRN: [id number] DOB/AGE: Dec 30, 1960 53 y.o.  Admit date: 02/19/2014 Discharge date: 02/21/2014  Admission Diagnoses:  Active Problems:   DJD (degenerative joint disease) of knee   Discharge Diagnoses:  Same  Past Medical History  Diagnosis Date  . Hypertension   . Reflux   . Osteoarthritis   . Complication of anesthesia   . PONV (postoperative nausea and vomiting)   . GERD (gastroesophageal reflux disease)   . Swelling     standing on feet and legs  . Asthma   . Anxiety     Surgeries: Procedure(s): RIGHT TOTAL KNEE ARTHROPLASTY on 02/19/2014   Consultants:    Discharged Condition: Improved  Hospital Course: Rhonda [name] is an 53 y.o. female who was admitted 02/19/2014 for operative treatment of right knee primary osteoarthritis. Patient has severe unremitting pain that affects sleep, daily activities, and work/hobbies. After pre-op clearance the patient was taken to the operating room on 02/19/2014 and underwent  Procedure(s): RIGHT TOTAL KNEE ARTHROPLASTY.  Patient had a pre-op hgb of 12.8.  She developed ABLA on POD #1 with a hgb of 11.0 and 9.8 on POD #2.  She is currently asymptomatic but we will continue to follow.  Patient was given perioperative antibiotics:     Anti-infectives   Start     Dose/Rate Route Frequency Ordered Stop   02/19/14 1630  ceFAZolin (ANCEF) IVPB 2 g/50 mL premix     2 g 100 mL/hr over 30 Minutes Intravenous Every 6 hours 02/19/14 1531 02/19/14 2340   02/19/14 0600  ceFAZolin (ANCEF) IVPB 2 g/50 mL premix     2 g 100 mL/hr over 30 Minutes Intravenous On call to O.R. 02/18/14 1418 02/19/14 1134       Patient was given sequential compression devices, early ambulation, and chemoprophylaxis to prevent DVT.  Patient benefited maximally from hospital stay and there were no complications.    Recent vital signs:  Patient Vitals for the past 24 hrs:  BP Temp Temp src Pulse Resp SpO2  02/21/14 0524 124/55  mmHg 98 F (36.7 C) - 67 18 96 %  02/21/14 0400 - - - - 16 -  02/20/14 2327 - - - - 17 -  02/20/14 2119 147/66 mmHg 98.9 F (37.2 C) - 80 16 95 %  02/20/14 2000 - - - - 17 -  02/20/14 1600 - - - - 14 100 %  02/20/14 1505 150/60 mmHg 97.4 F (36.3 C) Oral 78 16 100 %     Recent laboratory studies:   Recent Labs  02/20/14 0514 02/21/14 0555  WBC 11.6* 9.5  HGB 11.0* 9.8*  HCT 32.4* 28.2*  PLT 174 147*  NA 139  --   K 4.4  --   CL 100  --   CO2 24  --   BUN 12  --   CREATININE 0.59  --   GLUCOSE 148*  --   CALCIUM 8.8  --      Discharge Medications:     Medication List    STOP taking these medications       acetaminophen 500 MG tablet  Commonly known as:  TYLENOL      TAKE these medications       albuterol 108 (90 BASE) MCG/ACT inhaler  Commonly known as:  PROVENTIL HFA;VENTOLIN HFA  Inhale 2 puffs into the lungs every 4 (four) hours as needed for wheezing or shortness of breath.     aspirin EC 325 MG tablet  Take 1 tablet (325 mg total) by mouth daily.     bisacodyl 5 MG EC tablet  Commonly known as:  DULCOLAX  Take 1 tablet (5 mg total) by mouth daily as needed for moderate constipation.     CALCIUM 600 + D PO  Take 1 tablet by mouth at bedtime.     estrogens (conjugated) 0.3 MG tablet  Commonly known as:  PREMARIN  Take 0.3 mg by mouth daily.     methocarbamol 500 MG tablet  Commonly known as:  ROBAXIN  Take 1 tablet (500 mg total) by mouth 4 (four) times daily.     metoprolol 50 MG tablet  Commonly known as:  LOPRESSOR  Take 50 mg by mouth at bedtime.     omeprazole 20 MG capsule  Commonly known as:  PRILOSEC  Take 20 mg by mouth daily.     ondansetron 4 MG tablet  Commonly known as:  ZOFRAN  Take 1 tablet (4 mg total) by mouth every 8 (eight) hours as needed for nausea or vomiting.     oxyCODONE-acetaminophen 5-325 MG per tablet  Commonly known as:  ROXICET  Take 1-2 tablets by mouth every 4 (four) hours as needed.     ZANTAC 300 MG  tablet  Generic drug:  ranitidine  Take 300 mg by mouth at bedtime.        Diagnostic Studies: Dg Chest 2 View  02/10/2014   CLINICAL DATA:  Preoperative evaluation for knee surgery, history of hypertension and asthma  EXAM: CHEST  2 VIEW  COMPARISON:  03/24/2006  FINDINGS: The heart size and mediastinal contours are within normal limits. Both lungs are clear. The visualized skeletal structures show mild degenerative change of the thoracic spine.  IMPRESSION: No active cardiopulmonary disease.   Electronically Signed   By: Alcide CleverMark  Lukens M.D.   On: 02/10/2014 16:53    Disposition: 01-Home or Self Care  Discharge Instructions   CPM    Complete by:  As directed   Continuous passive motion machine (CPM):      Use the CPM from 0- to 60 for 6 hours per day.      You may increase by 10 per day.  You may break it up into 2 or 3 sessions per day.      Use CPM for 2-3 weeks or until you are told to stop.     Call MD / Call 911    Complete by:  As directed   If you experience chest pain or shortness of breath, CALL 911 and be transported to the hospital emergency room.  If you develope a fever above 101 F, pus (white drainage) or increased drainage or redness at the wound, or calf pain, call your surgeon's office.     Change dressing    Complete by:  As directed   Change dressing on Saturday, then change the dressing daily with sterile 4 x 4 inch gauze dressing and apply TED hose.  You may clean the incision with alcohol prior to redressing.     Constipation Prevention    Complete by:  As directed   Drink plenty of fluids.  Prune juice may be helpful.  You may use a stool softener, such as Colace (over the counter) 100 mg twice a day.  Use MiraLax (over the counter) for constipation as needed.     Diet - low sodium heart healthy    Complete by:  As directed      Discharge instructions  Complete by:  As directed   Weight bearing as tolerated.  Take Aspirin 325 mg 1 tab a day for the next 30  days to prevent blood clots.  Change bandage daily starting on Saturday.  May shower on Monday, but do not soak incisions.  May apply ice for up to 20 minutes at a time for pain and swelling.  Follow up appointment in two weeks.     Do not put a pillow under the knee. Place it under the heel.    Complete by:  As directed   Place gray foam under operative heel when in bed or in a chair to work on extension     Increase activity slowly as tolerated    Complete by:  As directed      TED hose    Complete by:  As directed   Use stockings (TED hose) for 2 weeks on both leg(s).  You may remove them at night for sleeping.           Follow-up Information   Follow up with [facility], MD. Schedule an appointment as soon as possible for a visit in 2 weeks.   Specialty:  Orthopedic Surgery   Contact information:   [address] [place] [address] [phone]        Signed: Gearldine Shown 02/21/2014, 7:40 AM

## 2014-02-19 NOTE — Anesthesia Postprocedure Evaluation (Signed)
  Anesthesia Post-op Note  Patient: Rhonda ChanceCathy S Pheasant  Procedure(s) Performed: Procedure(s): RIGHT TOTAL KNEE ARTHROPLASTY (Right)  Patient Location: PACU  Anesthesia Type:Spinal  Level of Consciousness: awake, alert  and oriented  Airway and Oxygen Therapy: Patient Spontanous Breathing and Patient connected to nasal cannula oxygen  Post-op Pain: mild  Post-op Assessment: Post-op Vital signs reviewed, Patient's Cardiovascular Status Stable, Respiratory Function Stable, Patent Airway, No signs of Nausea or vomiting and Pain level controlled  Post-op Vital Signs: stable  Last Vitals:  Filed Vitals:   02/19/14 1534  BP: 132/75  Pulse:   Temp: 36.8 C  Resp: 16    Complications: No apparent anesthesia complications

## 2014-02-19 NOTE — Interval H&P Note (Signed)
History and Physical Interval Note:  02/19/2014 8:31 AM  Rhonda [name]  has presented today for surgery, with the diagnosis of djd right knee  The various methods of treatment have been discussed with the patient and family. After consideration of risks, benefits and other options for treatment, the patient has consented to  Procedure(s): RIGHT TOTAL KNEE ARTHROPLASTY (Right) as a surgical intervention .  The patient's history has been reviewed, patient examined, no change in status, stable for surgery.  I have reviewed the patient's chart and labs.  Questions were answered to the patient's satisfaction.     MURPHY,DANIEL F

## 2014-02-19 NOTE — Evaluation (Signed)
Physical Therapy Evaluation Patient Details Name: Rhonda [name] MRN: [id number] DOB: 11/12/1960 Today's Date: 02/19/2014   History of Present Illness  s/p RTKA  Clinical Impression  Pt is s/p TKA resulting in the deficits listed below (see PT Problem List).  Pt will benefit from skilled PT to increase their independence and safety with mobility to allow discharge to the venue listed below.      Follow Up Recommendations Home health PT;Supervision/Assistance - 24 hour    Equipment Recommendations  Rolling walker with 5" wheels;3in1 (PT)    Recommendations for Other Services       Precautions / Restrictions Precautions Precautions: Knee Precaution Comments: Pt educated in importance of no blosters under knee Knee Immobilizer - Left: Other (comment) (use KI in room on POD 0; no specific order noted) Restrictions Weight Bearing Restrictions: Yes RLE Weight Bearing: Weight bearing as tolerated      Mobility  Bed Mobility Overal bed mobility: Modified Independent             General bed mobility comments: used bed rail  Transfers Overall transfer level: Needs assistance Equipment used: Rolling walker (2 wheeled) Transfers: Sit to/from Stand Sit to Stand: Min assist         General transfer comment: Cues for technique; mostly steadying assist  Ambulation/Gait Ambulation/Gait assistance: Min guard Ambulation Distance (Feet): 3 Feet Assistive device: Rolling walker (2 wheeled) Gait Pattern/deviations: Step-to pattern     General Gait Details: Pivot steps bed to chair; cues to self-monitor for activity tolerance; ended amb early due to pt reporting lightheadedness  Stairs            Wheelchair Mobility    Modified Rankin (Stroke Patients Only)       Balance                                             Pertinent Vitals/Pain Pain Assessment: 0-10 Pain Score: 4  Pain Location: R knee Pain Descriptors / Indicators:  Aching Pain Intervention(s): Monitored during session;Repositioned    Home Living Family/patient expects to be discharged to:: Private residence Living Arrangements: Spouse/significant other;Other relatives Available Help at Discharge: Family;Available 24 hours/day Type of Home: Mobile home Home Access: Stairs to enter Entrance Stairs-Rails: Right Entrance Stairs-Number of Steps: 4 Home Layout: One level Home Equipment: Walker - 2 wheels;Bedside commode      Prior Function Level of Independence: Independent               Hand Dominance        Extremity/Trunk Assessment   Upper Extremity Assessment: Overall WFL for tasks assessed           Lower Extremity Assessment: RLE deficits/detail RLE Deficits / Details: Grossly decr AROM and strength, limited by pain postop    Cervical / Trunk Assessment: Normal  Communication   Communication: No difficulties  Cognition Arousal/Alertness: Awake/alert Behavior During Therapy: WFL for tasks assessed/performed Overall Cognitive Status: Within Functional Limits for tasks assessed                      General Comments      Exercises        Assessment/Plan    PT Assessment Patient needs continued PT services  PT Diagnosis Difficulty walking;Acute pain   PT Problem List Decreased strength;Decreased range of motion;Decreased activity tolerance;Decreased balance;Decreased mobility;Decreased knowledge of  use of DME;Pain  PT Treatment Interventions DME instruction;Gait training;Stair training;Functional mobility training;Therapeutic activities;Therapeutic exercise;Patient/family education   PT Goals (Current goals can be found in the Care Plan section) Acute Rehab PT Goals Patient Stated Goal: back to work PT Goal Formulation: With patient Time For Goal Achievement: 02/26/14 Potential to Achieve Goals: Good    Frequency 7X/week   Barriers to discharge        Co-evaluation               End of  Session Equipment Utilized During Treatment: Gait belt Activity Tolerance: Patient tolerated treatment well Patient left: in chair;with call bell/phone within reach;with family/visitor present Nurse Communication: Mobility status         Time: [id number] PT Time Calculation (min): 29 min   Charges:   PT Evaluation $Initial PT Evaluation Tier I: 1 Procedure PT Treatments $Gait Training: 8-22 mins $Therapeutic Activity: 8-22 mins   PT G Codes:          Olen PelGarrigan, Inaara Tye Hamff 02/19/2014, 4:47 PM  Van ClinesHolly Appollonia [place], [place]  Acute Rehabilitation Services Pager [phone] Office [phone]

## 2014-02-19 NOTE — Anesthesia Procedure Notes (Signed)
Spinal  Patient location during procedure: OR Start time: 02/19/2014 11:22 AM End time: 02/19/2014 11:30 AM Staffing Anesthesiologist: Gaynelle AduFITZGERALD, Alima Naser E Performed by: anesthesiologist  Preanesthetic Checklist Completed: patient identified, surgical consent, pre-op evaluation, timeout performed, IV checked, risks and benefits discussed and monitors and equipment checked Spinal Block Patient position: sitting Prep: Betadine Patient monitoring: cardiac monitor, continuous pulse ox and blood pressure Approach: midline Location: L3-4 Injection technique: single-shot Needle Needle type: Pencan  Needle gauge: 24 G Needle length: 9 cm Assessment Sensory level: T6 Additional Notes Functioning IV was confirmed and monitors were applied. Sterile prep and drape, including hand hygiene and sterile gloves were used. The patient was positioned and the spine was prepped. The skin was anesthetized with lidocaine.  Free flow of clear CSF was obtained prior to injecting local anesthetic into the CSF.  The spinal needle aspirated freely following injection.  The needle was carefully withdrawn.  The patient tolerated the procedure well.

## 2014-02-19 NOTE — Discharge Instructions (Signed)
Total Knee Replacement, Care After Refer to this sheet in the next few weeks. These instructions provide you with information on caring for yourself after your procedure. Your health care provider also may give you specific instructions. Your treatment has been planned according to the most current medical practices, but problems sometimes occur. Call your health care provider if you have any problems or questions after your procedure. HOME CARE INSTRUCTIONS   Weight bearing as tolerated.  Take Aspirin 1 tab a day for the next 30 days to prevent blood clots.  Change bandage daily starting on Saturday.  May shower on Monday, but do not soak incisions.  May apply ice for up to 20 minutes at a time for pain and swelling.  Follow up appointment in two weeks.   See a physical therapist as directed by your health care provider.  Take medicines only as directed by your health care provider.  Avoid lifting or driving until you are instructed otherwise.  If you have been sent home with a continuous passive motion machine, use it as directed by your health care provider. SEEK MEDICAL CARE IF:  You have difficulty breathing.  You have drainage, redness, swelling, or pain at your incision site.  You have a bad smell coming from your incision site.  You have persistent bleeding from your incision site.  Your incision breaks open after sutures (stitches) or staples have been removed.  You have a fever. SEEK IMMEDIATE MEDICAL CARE IF:   You have a rash.  You have pain or swelling in your calf or thigh.  You have shortness of breath or chest pain.  Your range of motion in your knee is decreasing rather than increasing. MAKE SURE YOU:   Understand these instructions.  Will watch your condition.  Will get help right away if you are not doing well or get worse. Document Released: 10/29/2004 Document Revised: 08/26/2013 Document Reviewed: 05/31/2011 [facility] Patient Information 2015  [place], [place]. This information is not intended to replace advice given to you by your health care provider. Make sure you discuss any questions you have with your health care provider.

## 2014-02-19 NOTE — Anesthesia Preprocedure Evaluation (Addendum)
Anesthesia Evaluation  Patient identified by MRN, date of birth, ID band Patient awake    Reviewed: Allergy & Precautions, H&P , NPO status , Patient's Chart, lab work & pertinent test results, reviewed documented beta blocker date and time   History of Anesthesia Complications (+) PONV  Airway Mallampati: II  TM Distance: >3 FB Neck ROM: Full    Dental no notable dental hx. (+) Teeth Intact, Dental Advisory Given   Pulmonary asthma ,  breath sounds clear to auscultation  Pulmonary exam normal       Cardiovascular hypertension, On Medications and On Home Beta Blockers Rhythm:Regular Rate:Normal     Neuro/Psych Anxiety negative neurological ROS  negative psych ROS   GI/Hepatic Neg liver ROS, GERD-  Medicated and Controlled,  Endo/Other  negative endocrine ROS  Renal/GU negative Renal ROS  negative genitourinary   Musculoskeletal  (+) Arthritis -, Osteoarthritis,    Abdominal   Peds  Hematology negative hematology ROS (+)   Anesthesia Other Findings   Reproductive/Obstetrics negative OB ROS                            Anesthesia Physical Anesthesia Plan  ASA: II  Anesthesia Plan: MAC and Spinal   Post-op Pain Management:    Induction: Intravenous  Airway Management Planned: Simple Face Mask  Additional Equipment:   Intra-op Plan:   Post-operative Plan:   Informed Consent: I have reviewed the patients History and Physical, chart, labs and discussed the procedure including the risks, benefits and alternatives for the proposed anesthesia with the patient or authorized representative who has indicated his/her understanding and acceptance.   Dental advisory given  Plan Discussed with: CRNA and Surgeon  Anesthesia Plan Comments:        Anesthesia Quick Evaluation

## 2014-02-19 NOTE — Progress Notes (Signed)
Orthopedic Tech Progress Note Patient Details:  Rhonda [name] [birth year] [id number] Left Bone Foam with pt.'s nurse. [name], female   DOB: [birth year], 53 y.o.   MRN: [id number]   [name], [name] 02/19/2014, 2:22 PM

## 2014-02-19 NOTE — Plan of Care (Signed)
Problem: Consults Goal: Diagnosis- Total Joint Replacement Primary Total Knee Right     

## 2014-02-19 NOTE — H&P (View-Only) (Signed)
TOTAL KNEE ADMISSION H&P  Patient is being admitted for right total knee arthroplasty.  Subjective:  Chief Complaint:right knee pain.  HPI: Rhonda [name], 53 y.o. female, has a history of pain and functional disability in the right knee due to arthritis and has failed non-surgical conservative treatments for greater than 12 weeks to includeNSAID's and/or analgesics, corticosteriod injections, viscosupplementation injections and activity modification.  Onset of symptoms was gradual, starting 2 years ago with rapidlly worsening course since that time. The patient noted prior procedures on the knee to include  arthroscopy and menisectomy on the right knee(s).  Patient currently rates pain in the right knee(s) at 5 out of 10 with activity. Patient has night pain and joint swelling.  Patient has evidence of subchondral cysts, subchondral sclerosis and joint space narrowing by imaging studies. There is no active infection.  Patient Active Problem List   Diagnosis Date Noted  . Dizziness 11/29/2010  . Chest pain 11/18/2010  . Murmur 11/18/2010   Past Medical History  Diagnosis Date  . Hypertension   . Reflux   . Osteoarthritis   . Complication of anesthesia   . PONV (postoperative nausea and vomiting)   . GERD (gastroesophageal reflux disease)   . Swelling     standing on feet and legs    Past Surgical History  Procedure Laterality Date  . Total knee arthroplasty  2007    left  . Total abdominal hysterectomy  2011  . Joint replacement    . Foot surgery      left     (Not in a hospital admission) Allergies  Allergen Reactions  . Frovatriptan     History  Substance Use Topics  . Smoking status: Never Smoker   . Smokeless tobacco: Never Used  . Alcohol Use: No    No family history on file.   Review of Systems  Constitutional: Negative.   HENT: Negative.   Eyes: Negative.   Respiratory: Negative.   Cardiovascular: Negative.   Gastrointestinal: Negative.    Genitourinary: Negative.   Musculoskeletal: Positive for joint pain.  Skin: Negative.   Neurological: Negative.   Endo/Heme/Allergies: Negative.   Psychiatric/Behavioral: Negative.     Objective:  Physical Exam  Constitutional: She is oriented to person, place, and time. She appears well-developed and well-nourished.  HENT:  Head: Normocephalic and atraumatic.  Eyes: EOM are normal. Pupils are equal, round, and reactive to light.  Neck: Normal range of motion. Neck supple.  Cardiovascular: Normal rate and regular rhythm.  Exam reveals no gallop and no friction rub.   No murmur heard. Respiratory: Effort normal and breath sounds normal.  GI: Soft. Bowel sounds are normal.  Musculoskeletal:  Examination of the right knee reveals range of motion from 0 to 125 degrees with profound patellofemoral crepitus.  Tenderness to palpation lateral joint line.  Moderately increased Q-angle with lateral tracking and tethering.  No apprehension.  Negative log roll and negative straight leg raise.  She is neurovascularly intact distally.    Neurological: She is alert and oriented to person, place, and time.  Skin: Skin is warm and dry.  Psychiatric: She has a normal mood and affect. Her behavior is normal. Judgment and thought content normal.    Vital signs in last 24 hours: @VSRANGES @  Labs:   Estimated body mass index is 29.44 kg/(m^2) as calculated from the following:   Height as of 07/18/13: 5\' 11"  (1.803 m).   Weight as of 07/18/13: 95.709 kg (211 lb).   Imaging  Review Plain radiographs demonstrate severe degenerative joint disease of the right knee(s). The overall alignment ismild valgus. The bone quality appears to be fair for age and reported activity level.  Assessment/Plan:  End stage arthritis, right knee   The patient history, physical examination, clinical judgment of the provider and imaging studies are consistent with end stage degenerative joint disease of the right  knee(s) and total knee arthroplasty is deemed medically necessary. The treatment options including medical management, injection therapy arthroscopy and arthroplasty were discussed at length. The risks and benefits of total knee arthroplasty were presented and reviewed. The risks due to aseptic loosening, infection, stiffness, patella tracking problems, thromboembolic complications and other imponderables were discussed. The patient acknowledged the explanation, agreed to proceed with the plan and consent was signed. Patient is being admitted for inpatient treatment for surgery, pain control, PT, OT, prophylactic antibiotics, VTE prophylaxis, progressive ambulation and ADL's and discharge planning. The patient is planning to be discharged home with home health services   

## 2014-02-20 LAB — BASIC METABOLIC PANEL
Anion gap: 15 (ref 5–15)
BUN: 12 mg/dL (ref 6–23)
CALCIUM: 8.8 mg/dL (ref 8.4–10.5)
CO2: 24 meq/L (ref 19–32)
CREATININE: 0.59 mg/dL (ref 0.50–1.10)
Chloride: 100 mEq/L (ref 96–112)
GFR calc Af Amer: >90 mL/min (ref >90)
Glucose, Bld: 148 mg/dL — ABNORMAL HIGH (ref 70–99)
Potassium: 4.4 mEq/L (ref 3.7–5.3)
SODIUM: 139 meq/L (ref 137–147)

## 2014-02-20 LAB — CBC
HCT: 32.4 % — ABNORMAL LOW (ref 36.0–46.0)
Hemoglobin: 11 g/dL — ABNORMAL LOW (ref 12.0–15.0)
MCH: 30.2 pg (ref 26.0–34.0)
MCHC: 34 g/dL (ref 30.0–36.0)
MCV: 89 fL (ref 78.0–100.0)
Platelets: 174 10*3/uL (ref 150–400)
RBC: 3.64 MIL/uL — AB (ref 3.87–5.11)
RDW: 12.3 % (ref 11.5–15.5)
WBC: 11.6 10*3/uL — ABNORMAL HIGH (ref 4.0–10.5)

## 2014-02-20 NOTE — Progress Notes (Signed)
Subjective: 1 Day Post-Op Procedure(s) (LRB): RIGHT TOTAL KNEE ARTHROPLASTY (Right) Patient reports pain as 4 on 0-10 scale.  Patient reports quite a bit of pain in the superolateral aspect of her operative knee at the extremes of flexion.  This has been going on since being in the CPM machine yesterday.  Patient reports mild lightheadedness yesterday when sitting up in bed.  This has since resolved.  No nausea/vomiting.  Positive flatus but no bm as of yet.  Patient does not have much of an appetite right now.  Objective: Vital signs in last 24 hours: Temp:  [97.6 F (36.4 C)-98.2 F (36.8 C)] 97.6 F (36.4 C) (10/29 0049) Pulse Rate:  [59-77] 75 (10/29 0049) Resp:  [13-20] 17 (10/29 0049) BP: (116-158)/(64-88) 133/69 mmHg (10/29 0049) SpO2:  [98 %-100 %] 99 % (10/29 0049) Weight:  [93.895 kg (207 lb)] 93.895 kg (207 lb) (10/28 0908)  Intake/Output from previous day: 10/28 0701 - 10/29 0700 In: 1170 [P.O.:120; I.V.:1000; IV Piggyback:50] Out: 1495 [Urine:700; Drains:795] Intake/Output this shift: Total I/O In: 120 [P.O.:120] Out: 195 [Drains:195]   Recent Labs  02/20/14 0514  HGB 11.0*    Recent Labs  02/20/14 0514  WBC 11.6*  RBC 3.64*  HCT 32.4*  PLT 174   No results found for this basename: NA, K, CL, CO2, BUN, CREATININE, GLUCOSE, CALCIUM,  in the last 72 hours No results found for this basename: LABPT, INR,  in the last 72 hours  Neurologically intact Neurovascular intact Sensation intact distally Intact pulses distally Dorsiflexion/Plantar flexion intact Compartment soft Negative homans bilaterally hemovac drain pulled by me today  Assessment/Plan: 1 Day Post-Op Procedure(s) (LRB): RIGHT TOTAL KNEE ARTHROPLASTY (Right) Advance diet Up with therapy D/C IV fluids Discharge home with home health most likely today WBAT RLE ABLA- asymptomatic but will continue to follow  ANTON, M. LINDSEY 02/20/2014, 6:36 AM

## 2014-02-20 NOTE — Progress Notes (Signed)
Physical Therapy Treatment Patient Details Name: Rhonda [name] MRN: [id number] DOB: Oct 05, 1960 Today's Date: 02/20/2014    History of Present Illness s/p RTKA    PT Comments    Patient is making great progress this AM. Would like to review steps once more before deciding wether or not she is safe to DC home. Patient stated that MD stated it was up to patient wether or not she felt comfortable to leave today or tomorrow.   Follow Up Recommendations  Home health PT;Supervision/Assistance - 24 hour     Equipment Recommendations  Rolling walker with 5" wheels;3in1 (PT)    Recommendations for Other Services       Precautions / Restrictions Precautions Precautions: Knee Restrictions RLE Weight Bearing: Weight bearing as tolerated    Mobility  Bed Mobility Overal bed mobility: Needs Assistance Bed Mobility: Supine to Sit     Supine to sit: Min assist     General bed mobility comments: A for R LE.   Transfers Overall transfer level: Needs assistance Equipment used: Rolling walker (2 wheeled)   Sit to Stand: Min guard         General transfer comment: Cues for hand placement  Ambulation/Gait Ambulation/Gait assistance: Min guard Ambulation Distance (Feet): 250 Feet Assistive device: Rolling walker (2 wheeled) Gait Pattern/deviations: Step-to pattern;Decreased stance time - right;Decreased step length - left Gait velocity: decreased   General Gait Details: Cues for gait seqence and RW management   Stairs Stairs: Yes Stairs assistance: Min guard Stair Management: Step to pattern;Backwards;Forwards;No rails;Two rails Number of Stairs: 4 General stair comments: Patient practice two sequencial steps and one step forwards and backwards. Cues for sequency and technique  Wheelchair Mobility    Modified Rankin (Stroke Patients Only)       Balance                                    Cognition Arousal/Alertness: Awake/alert Behavior During  Therapy: WFL for tasks assessed/performed Overall Cognitive Status: Within Functional Limits for tasks assessed                      Exercises Total Joint Exercises Quad Sets: AROM;Right;10 reps Heel Slides: AAROM;Right;10 reps Hip ABduction/ADduction: AAROM;Right;10 reps Straight Leg Raises: AAROM;Right;10 reps    General Comments        Pertinent Vitals/Pain Pain Score: 5  Pain Location: R knee Pain Descriptors / Indicators: Aching Pain Intervention(s): Monitored during session;Limited activity within patient's tolerance;Patient requesting pain meds-RN notified    Home Living                      Prior Function            PT Goals (current goals can now be found in the care plan section) Progress towards PT goals: Progressing toward goals    Frequency  7X/week    PT Plan Current plan remains appropriate    Co-evaluation             End of Session Equipment Utilized During Treatment: Gait belt Activity Tolerance: Patient tolerated treatment well Patient left: in chair;with call bell/phone within reach     Time: 0816-0856 PT Time Calculation (min): 40 min  Charges:  $Gait Training: 23-37 mins $Therapeutic Exercise: 8-22 mins                    G Codes:  Fredrich BirksRobinette, Julia Elizabeth 02/20/2014, 9:15 AM  02/20/2014 Fredrich Birksobinette, Julia Elizabeth PTA [phone] pager [phone] office

## 2014-02-20 NOTE — Progress Notes (Signed)
Physical Therapy Treatment Patient Details Name: Rhonda [name] MRN: [id number] DOB: 25-Aug-1960 Today's Date: 02/20/2014    History of Present Illness s/p RTKA    PT Comments    Patient continues to make good progress with mobility. Able to practice steps again this afternoon. Patient has decided to stay another night due to nausea from pain meds. Will continue with current POC  Follow Up Recommendations  Home health PT;Supervision/Assistance - 24 hour     Equipment Recommendations  Rolling walker with 5" wheels;3in1 (PT)    Recommendations for Other Services       Precautions / Restrictions Precautions Precautions: Knee Required Braces or Orthoses: Knee Immobilizer - Right (no specific order but KI in room) Restrictions RLE Weight Bearing: Weight bearing as tolerated    Mobility  Bed Mobility               General bed mobility comments: Pt up in recliner upon my arrival  Transfers Overall transfer level: Modified independent Equipment used: Rolling walker (2 wheeled) Transfers: Sit to/from Stand Sit to Stand: Supervision            Ambulation/Gait Ambulation/Gait assistance: Supervision Ambulation Distance (Feet): 300 Feet   Gait Pattern/deviations: Step-through pattern;Decreased stride length Gait velocity: decreased   General Gait Details: Patient working on step through pattern. Good and safe use of RW this session   Stairs   Stairs assistance: Min guard Stair Management: Two rails;Forwards Number of Stairs: 2 General stair comments: Patient able to recall technique.   Wheelchair Mobility    Modified Rankin (Stroke Patients Only)       Balance                                    Cognition Arousal/Alertness: Awake/alert Behavior During Therapy: WFL for tasks assessed/performed Overall Cognitive Status: Within Functional Limits for tasks assessed                      Exercises Total Joint Exercises Quad  Sets: AROM;Right;10 reps Heel Slides: AAROM;Right;10 reps Hip ABduction/ADduction: AAROM;Right;10 reps Straight Leg Raises: AAROM;Right;10 reps    General Comments        Pertinent Vitals/Pain Pain Score: 4  Pain Location: R knee Pain Descriptors / Indicators: Sore Pain Intervention(s): Monitored during session    Home Living Family/patient expects to be discharged to:: Private residence Living Arrangements: Spouse/significant other;Other relatives Available Help at Discharge: Family;Available 24 hours/day Type of Home: Mobile home Home Access: Stairs to enter Entrance Stairs-Rails: Right Home Layout: One level Home Equipment: Walker - 2 wheels;Bedside commode;Shower seat - built in      Prior Function Level of Independence: Independent          PT Goals (current goals can now be found in the care plan section) Acute Rehab PT Goals Patient Stated Goal: home today  Progress towards PT goals: Progressing toward goals    Frequency  7X/week    PT Plan Current plan remains appropriate    Co-evaluation             End of Session Equipment Utilized During Treatment: Gait belt Activity Tolerance: Patient tolerated treatment well Patient left: in chair;with call bell/phone within reach;with family/visitor present     Time: [id number] PT Time Calculation (min): 25 min  Charges:  $Gait Training: 8-22 mins $Therapeutic Exercise: 8-22 mins  G Codes:      Fredrich BirksRobinette, Julia Elizabeth 02/20/2014, 2:14 PM 02/20/2014 Fredrich Birksobinette, Julia Elizabeth PTA [phone] pager [phone] office

## 2014-02-20 NOTE — Progress Notes (Signed)
Utilization review completed.  

## 2014-02-20 NOTE — Evaluation (Signed)
Occupational Therapy Evaluation and Discharge Patient Details Name: Rhonda [name] MRN: [id number] DOB: 12-Aug-1960 Today's Date: 02/20/2014    History of Present Illness s/p RTKA   Clinical Impression   This 53 yo female admitted and underwent above presents to acute OT with all education completed and no further questions about BADLs, we will sign off.    Follow Up Recommendations  No OT follow up    Equipment Recommendations  None recommended by OT       Precautions / Restrictions Precautions Precautions: Knee Restrictions Weight Bearing Restrictions: Yes RLE Weight Bearing: Weight bearing as tolerated      Mobility Bed Mobility     General bed mobility comments: Pt up in recliner upon my arrival  Transfers Overall transfer level: Needs assistance Equipment used: Rolling walker (2 wheeled) Transfers: Sit to/from Stand Sit to Stand: Supervision                 ADL                                         General ADL Comments: Pt reports husband will A her with LBADLs until she can do them for herself and her husband agrees. I went over the most efficient way of getting dressed. I went over with pt and she returned demonstrated with her husband present how to get in and out of shower stall to a seat and use of a grab bar (she has a towel bar on her shower stall door--and is aware to only use this to steady herself not as a grab bar,). Pt's husband will steady her RW if she needs to use this for sit<>stand in shower.               Pertinent Vitals/Pain Pain Assessment: 0-10 Pain Score: 5  Pain Location: R knee Pain Descriptors / Indicators: Aching Pain Intervention(s): Premedicated before session;Monitored during session;Repositioned     Hand Dominance Right   Extremity/Trunk Assessment Upper Extremity Assessment Upper Extremity Assessment: Overall WFL for tasks assessed           Communication Communication Communication:  No difficulties   Cognition Arousal/Alertness: Awake/alert Behavior During Therapy: WFL for tasks assessed/performed Overall Cognitive Status: Within Functional Limits for tasks assessed                                Home Living Family/patient expects to be discharged to:: Private residence Living Arrangements: Spouse/significant other;Other relatives Available Help at Discharge: Family;Available 24 hours/day Type of Home: Mobile home Home Access: Stairs to enter Entrance Stairs-Number of Steps: 4 Entrance Stairs-Rails: Right Home Layout: One level     Bathroom Shower/Tub: Walk-in shower;Door   Foot LockerBathroom Toilet: Standard     Home Equipment: Environmental consultantWalker - 2 wheels;Bedside commode;Shower seat - built in          Prior Functioning/Environment Level of Independence: Independent             OT Diagnosis: Generalized weakness         OT Goals(Current goals can be found in the care plan section) Acute Rehab OT Goals Patient Stated Goal: home today   OT Frequency:                End of Session Equipment Utilized During Treatment: Rolling walker;Right knee immobilizer CPM Right  Knee CPM Right Knee: Off  Activity Tolerance: Patient tolerated treatment well Patient left: in chair;with call bell/phone within reach;with family/visitor present   Time: 0903-0930 OT Time Calculation (min): 27 min Charges:  OT General Charges $OT Visit: 1 Procedure OT Evaluation $Initial OT Evaluation Tier I: 1 Procedure OT Treatments $Self Care/Home Management : 8-22 mins  Evette GeorgesLeonard, Tristram Milian Eva [phone] 02/20/2014, 10:49 AM

## 2014-02-20 NOTE — Op Note (Signed)
[name]:  [name], Rhonda             ACCOUNT NO.:  [id number]  MEDICAL RECORD NO.:  [id number]  LOCATION:  [id number]                        FACILITY:  [facility]  PHYSICIAN:  Loreta Aveaniel F. Mirna Sutcliffe, M.D. DATE OF BIRTH:  1961-03-10  DATE OF PROCEDURE:  02/19/2014 DATE OF DISCHARGE:                              OPERATIVE REPORT   PREOPERATIVE DIAGNOSIS:  Right knee end-stage degenerative arthritis. This is generalized arthritis.  She has already had the opposite knee replaced in the past.  POSTOPERATIVE DIAGNOSIS:  Right knee end-stage degenerative arthritis. This is generalized arthritis. She has already had the opposite knee replaced in the past.  PROCEDURE:  Right knee modified minimally invasive total knee replacement Stryker triathlon prosthesis.  Soft tissue balancing. Cemented pegged posterior stabilized #5 femoral component.  Cemented #5 tibial component, 9-mm polyethylene insert.  Cemented resurfacing 35-mm patellar component.  SURGEON:  Loreta Aveaniel F. Honest Safranek, M.D.  ASSISTANT:  Odelia GageLindsey Anton, PA, present throughout the entire case and necessary for timely completion of procedure.  ANESTHESIA:  Spinal.  BLOOD LOSS:  Minimal.  SPECIMENS:  None.  CULTURES:  None.  COMPLICATIONS:  None.  DRESSINGS:  Soft compressive knee immobilizer.  DRAINS:  Hemovac x1.  TOURNIQUET TIME:  45 minutes.  DESCRIPTION OF PROCEDURE:  The patient was brought to the operating room, placed on the operating table in supine position.  After adequate anesthesia had been obtained, tourniquet applied, prepped and draped in usual sterile fashion.  Exsanguinated with elevation of Esmarch. Tourniquet inflated to 350 mmHg.  Straight incision above the patella down to tibial tubercle.  Medial arthrotomy, vastus splitting, preserving quad tendon.  Medial capsule release.  Intramedullary guide distal femur flexible rod.  An 8-mm resection 5 degrees of valgus. Using epicondylar axis, the femur was sized, cut, and  fitted for a pegged posterior stabilized #5 component.  Proximal tibial resection extramedullary guide.  Size #5 component as well.  Posterior 10 mm removed from the patella.  Drilled, sized, and fitted for a 35-mm component.  Trials put in place.  A #5 femur and tibia 9-mm insert, 35 patella.  With this construct, I was very pleased of biomechanical axis, nicely balanced in flexion and extension.  Good patellar tracking. Tibia was marked for rotation and hand reamed.  All trials were removed. Copious irrigation with a pulse irrigating device.  Cement prepared, placed on all components, firmly seated.  Polyethylene attached to tibia and knee reduced.  Patella held with a clamp.  Once cement hardened, the knee was irrigated again.  Soft tissues injected with Exparel. Arthrotomy closed with #1 Vicryl with the subcutaneous and subcuticular closure.  Margins were injected with Marcaine.  Sterile compressive dressing applied.  Tourniquet deflated and removed.  Knee immobilizer applied.  The anesthesia reversed.  Brought to the recovery room. Tolerated the surgery well.  No complications.     Loreta Aveaniel F. Neah Sporrer, M.D.     DFM/MEDQ  D:  02/19/2014  T:  02/19/2014  Job:  [id number]

## 2014-02-21 ENCOUNTER — Encounter (HOSPITAL_COMMUNITY): Payer: Self-pay | Admitting: Orthopedic Surgery

## 2014-02-21 LAB — BASIC METABOLIC PANEL
Anion gap: 11 (ref 5–15)
BUN: 10 mg/dL (ref 6–23)
CALCIUM: 8.7 mg/dL (ref 8.4–10.5)
CO2: 27 meq/L (ref 19–32)
Chloride: 103 mEq/L (ref 96–112)
Creatinine, Ser: 0.74 mg/dL (ref 0.50–1.10)
GFR calc Af Amer: >90 mL/min (ref >90)
Glucose, Bld: 135 mg/dL — ABNORMAL HIGH (ref 70–99)
Potassium: 3.9 mEq/L (ref 3.7–5.3)
SODIUM: 141 meq/L (ref 137–147)

## 2014-02-21 LAB — CBC
HCT: 28.2 % — ABNORMAL LOW (ref 36.0–46.0)
Hemoglobin: 9.8 g/dL — ABNORMAL LOW (ref 12.0–15.0)
MCH: 30.9 pg (ref 26.0–34.0)
MCHC: 34.8 g/dL (ref 30.0–36.0)
MCV: 89 fL (ref 78.0–100.0)
Platelets: 147 10*3/uL — ABNORMAL LOW (ref 150–400)
RBC: 3.17 MIL/uL — AB (ref 3.87–5.11)
RDW: 12.5 % (ref 11.5–15.5)
WBC: 9.5 10*3/uL (ref 4.0–10.5)

## 2014-02-21 NOTE — Progress Notes (Signed)
Physical Therapy Treatment Patient Details Name: Loistine ChanceCathy S Raybourn MRN: [id number] DOB: 1960/10/08 Today's Date: 02/21/2014    History of Present Illness s/p RTKA    PT Comments    Patient making good gains with PT.  Ready for d/c from PT perspective.  Will have f/u HHPT at discharge.  Follow Up Recommendations  Home health PT;Supervision/Assistance - 24 hour     Equipment Recommendations  Rolling walker with 5" wheels;3in1 (PT)    Recommendations for Other Services       Precautions / Restrictions Precautions Precautions: Knee Required Braces or Orthoses: Knee Immobilizer - Right Knee Immobilizer - Right: On when out of bed or walking Restrictions Weight Bearing Restrictions: Yes RLE Weight Bearing: Weight bearing as tolerated    Mobility  Bed Mobility                  Transfers Overall transfer level: Modified independent Equipment used: Rolling walker (2 wheeled) Transfers: Sit to/from Stand Sit to Stand: Modified independent (Device/Increase time)         General transfer comment: Uses proper technique.  Ambulation/Gait Ambulation/Gait assistance: Supervision Ambulation Distance (Feet): 320 Feet Assistive device: Rolling walker (2 wheeled) Gait Pattern/deviations: Step-through pattern;Decreased stance time - right;Decreased step length - left;Antalgic Gait velocity: decreased Gait velocity interpretation: Below normal speed for age/gender General Gait Details: Patient demonstrates safe use of RW and good gait pattern.  Able to use step-through gait pattern.  Supervision for safety only.   Stairs            Wheelchair Mobility    Modified Rankin (Stroke Patients Only)       Balance                                    Cognition Arousal/Alertness: Awake/alert Behavior During Therapy: WFL for tasks assessed/performed Overall Cognitive Status: Within Functional Limits for tasks assessed                       Exercises Total Joint Exercises Ankle Circles/Pumps: AROM;Both;10 reps;Seated Quad Sets: AROM;Right;10 reps;Seated Heel Slides: AAROM;Right;5 reps;Seated Hip ABduction/ADduction: AROM;Right;10 reps;Seated Long Arc Quad: AROM;Right;10 reps;Seated Knee Flexion: AROM;Right;10 reps;Seated Goniometric ROM: -10* to 70*    General Comments        Pertinent Vitals/Pain Pain Assessment: 0-10 Pain Score: 2  Pain Location: Rt knee Pain Descriptors / Indicators: Sore Pain Intervention(s): Monitored during session    Home Living                      Prior Function            PT Goals (current goals can now be found in the care plan section) Acute Rehab PT Goals Patient Stated Goal: home today  Progress towards PT goals: Progressing toward goals    Frequency  7X/week    PT Plan Current plan remains appropriate    Co-evaluation             End of Session Equipment Utilized During Treatment: Gait belt;Right knee immobilizer Activity Tolerance: Patient tolerated treatment well Patient left: in chair;with call bell/phone within reach     Time: [id number] PT Time Calculation (min): 24 min  Charges:  $Gait Training: 8-22 mins $Therapeutic Exercise: 8-22 mins                    G Codes:  Vena AustriaDavis, Nasiah Polinsky H 02/21/2014, 12:47 PM Durenda HurtSusan H. Renaldo Fiddleravis, PT, [facility] Acute Rehab Services Pager [phone]

## 2014-02-21 NOTE — Progress Notes (Signed)
Subjective: 2 Days Post-Op Procedure(s) (LRB): RIGHT TOTAL KNEE ARTHROPLASTY (Right) Patient reports pain as 2 on 0-10 scale.  No nausea/vomiting, lightheadedness/dizziness.  Positive flatus and bm.  Patient is tolerating diet and is anticipating d/c to home today.  Objective: Vital signs in last 24 hours: Temp:  [97.4 F (36.3 C)-98.9 F (37.2 C)] 98 F (36.7 C) (10/30 0524) Pulse Rate:  [67-80] 67 (10/30 0524) Resp:  [14-18] 18 (10/30 0524) BP: (124-150)/(55-66) 124/55 mmHg (10/30 0524) SpO2:  [95 %-100 %] 96 % (10/30 0524)  Intake/Output from previous day: 10/29 0701 - 10/30 0700 In: 840 [P.O.:840] Out: -  Intake/Output this shift:     Recent Labs  02/20/14 0514 02/21/14 0555  HGB 11.0* 9.8*    Recent Labs  02/20/14 0514 02/21/14 0555  WBC 11.6* 9.5  RBC 3.64* 3.17*  HCT 32.4* 28.2*  PLT 174 147*    Recent Labs  02/20/14 0514  NA 139  K 4.4  CL 100  CO2 24  BUN 12  CREATININE 0.59  GLUCOSE 148*  CALCIUM 8.8   No results found for this basename: LABPT, INR,  in the last 72 hours  Neurologically intact Neurovascular intact Sensation intact distally Intact pulses distally Dorsiflexion/Plantar flexion intact Incision: scant drainage No cellulitis present Compartment soft Negative homans bilaterally Dressing changed by me today  Assessment/Plan: 2 Days Post-Op Procedure(s) (LRB): RIGHT TOTAL KNEE ARTHROPLASTY (Right) Advance diet Up with therapy Discharge home with home health WBAT RLE Please place ted hose RLE prior to d/c  ANTON, M. LINDSEY 02/21/2014, 7:38 AM

## 2014-02-21 NOTE — Care Management Note (Signed)
ARE MANAGEMENT NOTE 02/21/2014  Patient:  Loistine ChanceRMSTRONG,Aashi S   Account Number:  [id number]  Date Initiated:  02/21/2014  Documentation initiated by:  Vance PeperBRADY,Kehinde Bowdish  Subjective/Objective Assessment:   53 yr old female admitted with right knee DJD. Patient underwent a right total knee arthroplasty.     Action/Plan:   [facility]. Has support at discharge.   Anticipated DC Date:  02/21/2014   Anticipated DC Plan:  HOME W HOME HEALTH SERVICES      DC Planning Services  CM consult      [facility]  HOME HEALTH  DURABLE MEDICAL EQUIPMENT   Choice offered to / List presented to:  C-1 Patient   DME arranged  CPM      DME agency  [employer]     HH arranged  HH[facility]2 PT      [facility]  Status of service:  Completed, signed off Medicare Important Message given?   (If response is "NO", the following Medicare IM given date fields will be blank) Date Medicare IM given:   Medicare IM given by:   Date Additional Medicare IM given:   Additional Medicare IM given by:    Discharge Disposition:  HOME W HOME HEALTH SERVICES  Per UR Regulation:  Reviewed for med. necessity/level of care/duration of stay

## 2014-02-24 NOTE — Progress Notes (Signed)
02/24/14 Received [facility]Jerry [facility] be able to service [facility] for HHPT. Contacted patient, [facility], requested that patient be seen today. Jacquelynn CreeMary Alphonza Tramell RN, BSN, CCM

## 2015-11-24 ENCOUNTER — Other Ambulatory Visit (HOSPITAL_COMMUNITY): Payer: Self-pay | Admitting: Orthopedic Surgery

## 2015-11-24 DIAGNOSIS — T84032A Mechanical loosening of internal right knee prosthetic joint, initial encounter: Secondary | ICD-10-CM

## 2015-11-26 ENCOUNTER — Encounter (HOSPITAL_COMMUNITY)
Admission: RE | Admit: 2015-11-26 | Discharge: 2015-11-26 | Disposition: A | Discharge status: Home / Self Care | Payer: Commercial Managed Care - PPO | Source: Ambulatory Visit | Attending: Orthopedic Surgery | Admitting: Orthopedic Surgery

## 2015-11-26 DIAGNOSIS — T84032A Mechanical loosening of internal right knee prosthetic joint, initial encounter: Secondary | ICD-10-CM

## 2015-11-26 DIAGNOSIS — Z96651 Presence of right artificial knee joint: Secondary | ICD-10-CM | POA: Diagnosis not present

## 2015-11-26 DIAGNOSIS — M25561 Pain in right knee: Secondary | ICD-10-CM | POA: Insufficient documentation

## 2015-11-26 MED ORDER — TECHNETIUM TC 99M MEDRONATE IV KIT
25.0000 | PACK | Freq: Once | INTRAVENOUS | Status: AC | PRN
  Administered 2015-11-26: 25 via INTRAVENOUS

## 2017-02-26 IMAGING — NM NM BONE 3 PHASE
9 series · 19 of 19 positions shown · non-contrast
Comparison: None

CLINICAL DATA: RIGHT knee pain and swelling, post RIGHT TKR January 2014, LEFT TKR 8114

EXAM:
NUCLEAR MEDICINE 3-PHASE BONE SCAN
TECHNIQUE: Radionuclide angiographic images, immediate static blood pool
images, and 3-hour delayed static images were obtained of the knees
after intravenous injection of radiopharmaceutical.
RADIOPHARMACEUTICALS:  26 mCi 4c-DDm MDP IV

[Series 1: flow · 2.07mm/px · 6 of 48 frames shown (1 of 2)]
[frame 5/48  full-range]
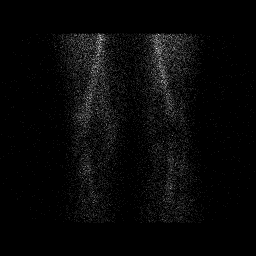
[frame 13/48  full-range]
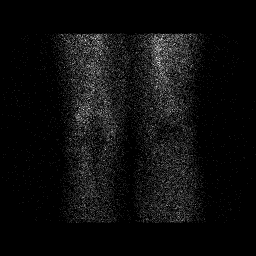
[frame 21/48  full-range]
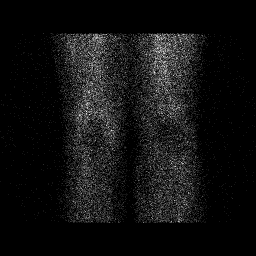
[frame 29/48  full-range]
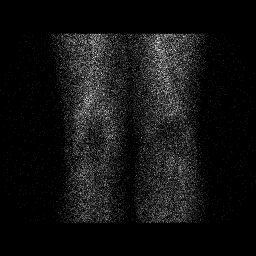
[frame 37/48  full-range]
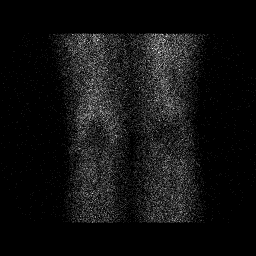
[frame 45/48  full-range]
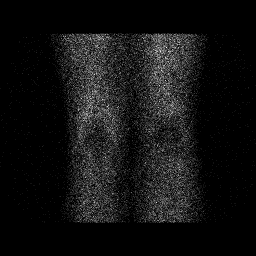

[Series 1: flow · 2.07mm/px · 6 of 48 frames shown (2 of 2)]
[frame 5/48  full-range]
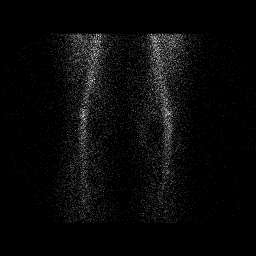
[frame 13/48  full-range]
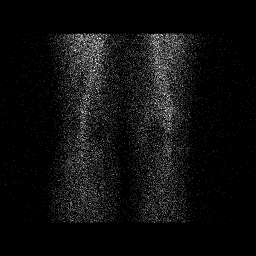
[frame 21/48  full-range]
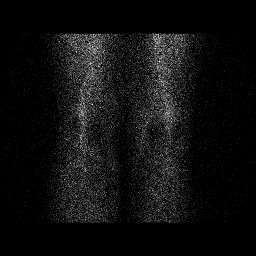
[frame 29/48  full-range]
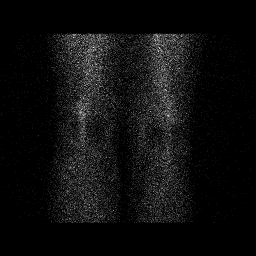
[frame 37/48  full-range]
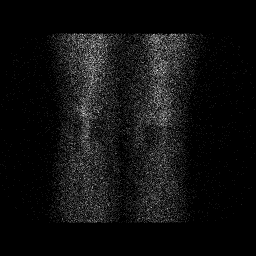
[frame 45/48  full-range]
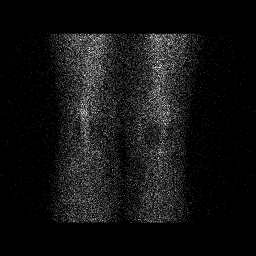

[Series 2: blood pool · 2.07mm/px · 1 of 1 slices shown (1 of 5)]
[im 1/1  full-range]
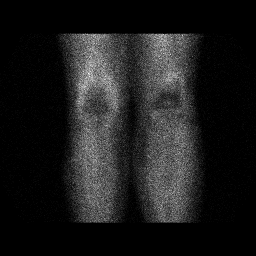

[Series 2: blood pool · 2.07mm/px · 1 of 1 slices shown (2 of 5)]
[im 1/1  full-range]
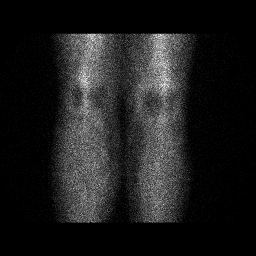

[Series 3: lat bp · 2.07mm/px · 1 of 1 slices shown (1 of 2)]
[im 1/1]
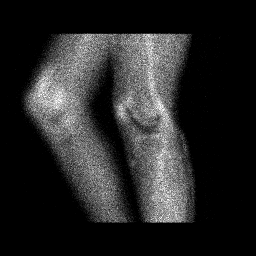

[Series 3: lat bp · 2.07mm/px · 1 of 1 slices shown (2 of 2)]
[im 1/1]
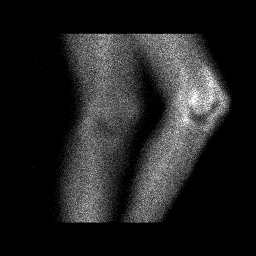

[Series 4: blood pool · 2.07mm/px · 1 of 1 slices shown (3 of 5)]
[im 1/1  full-range]
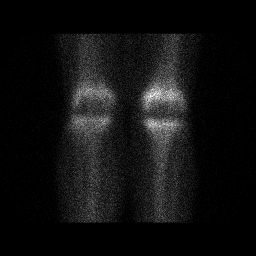

[Series 5: blood pool · 2.07mm/px · 1 of 1 slices shown (4 of 5)]
[im 1/1]
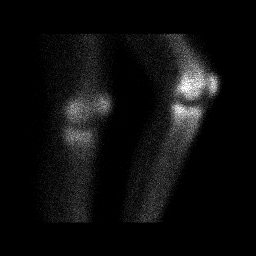

[Series 5: blood pool · 2.07mm/px · 1 of 1 slices shown (5 of 5)]
[im 1/1]
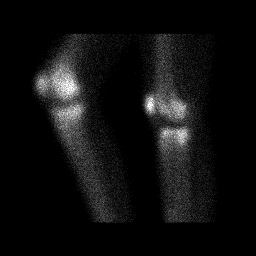

[19 of 19 positions shown; findings below may reference images not displayed]

Radiographic correlation: Immediate postoperative RIGHT knee
radiographs 02/19/2014
FINDINGS: Vascular phase: Normal

Blood pool phase: Mildly increased trace localization surrounding
RIGHT knee joint diffusely.

Delayed phase: Mild diffuse increased trace localization within
osseous structures surrounding both knee prostheses. No focal areas
of increased tracer localization identified.
IMPRESSION: Increased blood pool surrounding RIGHT knee joint compatible with
synovitis.

Expected postoperative uptake on delayed images at both knees
without scintigraphic evidence of prosthetic loosening.

## 2020-12-09 ENCOUNTER — Other Ambulatory Visit: Payer: Self-pay | Admitting: Family Medicine

## 2020-12-09 DIAGNOSIS — Z1231 Encounter for screening mammogram for malignant neoplasm of breast: Secondary | ICD-10-CM

## 2022-12-16 ENCOUNTER — Other Ambulatory Visit: Payer: Self-pay

## 2022-12-16 ENCOUNTER — Emergency Department (HOSPITAL_COMMUNITY): Payer: BC Managed Care – PPO

## 2022-12-16 ENCOUNTER — Encounter (HOSPITAL_COMMUNITY): Payer: Self-pay | Admitting: Emergency Medicine

## 2022-12-16 ENCOUNTER — Emergency Department (HOSPITAL_COMMUNITY)
Admission: EM | Admit: 2022-12-16 | Discharge: 2022-12-16 | Disposition: A | Discharge status: Home / Self Care | Payer: BC Managed Care – PPO | Source: Home / Self Care | Attending: Emergency Medicine | Admitting: Emergency Medicine

## 2022-12-16 DIAGNOSIS — R55 Syncope and collapse: Secondary | ICD-10-CM | POA: Insufficient documentation

## 2022-12-16 DIAGNOSIS — R42 Dizziness and giddiness: Secondary | ICD-10-CM | POA: Diagnosis present

## 2022-12-16 DIAGNOSIS — Z7982 Long term (current) use of aspirin: Secondary | ICD-10-CM | POA: Insufficient documentation

## 2022-12-16 LAB — TROPONIN I (HIGH SENSITIVITY): Troponin I (High Sensitivity): 5 ng/L (ref <18)

## 2022-12-16 LAB — BASIC METABOLIC PANEL
Anion gap: 9 (ref 5–15)
BUN: 11 mg/dL (ref 8–23)
CO2: 26 mmol/L (ref 22–32)
Calcium: 9.2 mg/dL (ref 8.9–10.3)
Chloride: 106 mmol/L (ref 98–111)
Creatinine, Ser: 0.71 mg/dL (ref 0.44–1.00)
GFR, Estimated: >60 mL/min (ref >60)
Glucose, Bld: 113 mg/dL — ABNORMAL HIGH (ref 70–99)
Potassium: 3.8 mmol/L (ref 3.5–5.1)
Sodium: 141 mmol/L (ref 135–145)

## 2022-12-16 LAB — CBC
HCT: 35.3 % — ABNORMAL LOW (ref 36.0–46.0)
Hemoglobin: 11.9 g/dL — ABNORMAL LOW (ref 12.0–15.0)
MCH: 30.4 pg (ref 26.0–34.0)
MCHC: 33.7 g/dL (ref 30.0–36.0)
MCV: 90.1 fL (ref 80.0–100.0)
Platelets: 203 10*3/uL (ref 150–400)
RBC: 3.92 MIL/uL (ref 3.87–5.11)
RDW: 11.9 % (ref 11.5–15.5)
WBC: 6.9 10*3/uL (ref 4.0–10.5)
nRBC: 0 % (ref 0.0–0.2)

## 2022-12-16 NOTE — Discharge Instructions (Signed)
Eat and drink as well as you can for the next couple days.  Please return for recurrent event, chest pain, difficulty breathing, if you start coughing up blood or if you pass out.

## 2022-12-16 NOTE — ED Provider Notes (Signed)
[place] EMERGENCY DEPARTMENT AT [facility] Provider Note   CSN: [id number] Arrival date & time: 12/16/22  1525     History  Chief Complaint  Patient presents with   Chest Pain    Rhonda [name] is a 62 y.o. female.  62 yo F with a chief complaints of not feeling well.  She said 2 different times at work she suddenly felt warm all over and felt like she might throw up.  Both times this resolved on its own.  Neither were exertional.  She thinks maybe she overdid it yesterday she did a lot of yard work, which she had any specific symptoms.  She has been having some right-sided back pain that seems to be related to her work.  She does not have any real issues at home with this.  She denies cough congestion or fever.  She went to urgent care and they were concerned about her EKG and sent her here for evaluation.  She denied any chest pain.   Chest Pain      Home Medications Prior to Admission medications   Medication Sig Start Date End Date Taking? Authorizing Provider  albuterol (PROVENTIL HFA;VENTOLIN HFA) 108 (90 BASE) MCG/ACT inhaler Inhale 2 puffs into the lungs every 4 (four) hours as needed for wheezing or shortness of breath.    [provider]  aspirin EC 325 MG tablet Take 1 tablet (325 mg total) by mouth daily. 02/19/14   [name], PA-C  bisacodyl (DULCOLAX) 5 MG EC tablet Take 1 tablet (5 mg total) by mouth daily as needed for moderate constipation. 02/19/14   [name], PA-C  Calcium Carb-Cholecalciferol (CALCIUM 600 + D PO) Take 1 tablet by mouth at bedtime.    [provider]  estrogens, conjugated, (PREMARIN) 0.3 MG tablet Take 0.3 mg by mouth daily.    [provider]  methocarbamol (ROBAXIN) 500 MG tablet Take 1 tablet (500 mg total) by mouth 4 (four) times daily. 02/19/14   [name], PA-C  metoprolol (LOPRESSOR) 50 MG tablet Take 50 mg by mouth at bedtime.    [provider]  omeprazole  (PRILOSEC) 20 MG capsule Take 20 mg by mouth daily.      [provider]  ondansetron (ZOFRAN) 4 MG tablet Take 1 tablet (4 mg total) by mouth every 8 (eight) hours as needed for nausea or vomiting. 02/19/14   [name], PA-C  oxyCODONE-acetaminophen (ROXICET) 5-325 MG per tablet Take 1-2 tablets by mouth every 4 (four) hours as needed. 02/19/14   [name], PA-C  ranitidine (ZANTAC) 300 MG tablet Take 300 mg by mouth at bedtime.    [provider]      Allergies    Frovatriptan    Review of Systems   Review of Systems  Cardiovascular:  Positive for chest pain.    Physical Exam Updated Vital Signs BP (!) 151/69   Pulse (!) 55   Temp 97.8 F (36.6 C) (Oral)   Resp 18   SpO2 99%  Physical Exam Vitals and nursing note reviewed.  Constitutional:      General: She is not in acute distress.    Appearance: She is well-developed. She is not diaphoretic.  HENT:     Head: Normocephalic and atraumatic.  Eyes:     Pupils: Pupils are equal, round, and reactive to light.  Cardiovascular:     Rate and Rhythm: Normal rate and regular rhythm.  Heart sounds: No murmur heard.    No friction rub. No gallop.  Pulmonary:     Effort: Pulmonary effort is normal.     Breath sounds: No wheezing or rales.  Abdominal:     General: There is no distension.     Palpations: Abdomen is soft.     Tenderness: There is no abdominal tenderness.  Musculoskeletal:        General: No tenderness.     Cervical back: Normal range of motion and neck supple.  Skin:    General: Skin is warm and dry.  Neurological:     Mental Status: She is alert and oriented to person, place, and time.  Psychiatric:        Behavior: Behavior normal.     ED Results / Procedures / Treatments   Labs (all labs ordered are listed, but only abnormal results are displayed) Labs Reviewed  BASIC METABOLIC PANEL - Abnormal; Notable for the following components:      Result Value   Glucose, Bld  113 (*)    All other components within normal limits  CBC - Abnormal; Notable for the following components:   Hemoglobin 11.9 (*)    HCT 35.3 (*)    All other components within normal limits  TROPONIN I (HIGH SENSITIVITY)    EKG EKG Interpretation Date/Time:  Friday December 16 2022 15:51:53 EDT Ventricular Rate:  58 PR Interval:  162 QRS Duration:  84 QT Interval:  420 QTC Calculation: 412 R Axis:   54  Text Interpretation: Sinus bradycardia Otherwise normal ECG No significant change since last tracing Confirmed by [name] Plan [phone]) on 12/16/2022 5:57:15 PM  Radiology DG Chest 2 View  Result Date: 12/16/2022 CLINICAL DATA:  Chest pain EXAM: CHEST - 2 VIEW COMPARISON:  X-ray 02/10/2014 FINDINGS: No consolidation, pneumothorax or effusion. No edema. Normal cardiopericardial silhouette. Chronic appearing interstitial change. Degenerative changes of the spine IMPRESSION: No acute cardiopulmonary disease. Electronically Signed   By: [name]   On: 12/16/2022 16:59    Procedures Procedures    Medications Ordered in ED Medications - No data to display  ED Course/ Medical Decision Making/ A&P                                 Medical Decision Making Amount and/or Complexity of Data Reviewed Labs: ordered. Radiology: ordered.   62 yo F with a chief complaints of feeling warm all over and feeling nauseated.  This occurred a couple times a day and has resolved.  She went to urgent care and they were concerned about her EKG and sent her here for evaluation.  She denies ever having any chest pain.  I considered gallbladder pathology but she has no pain in the right upper quadrant.  Has a negative Murphy sign.  Her troponins negative.  EKG is nonischemic, I do not see any signs of ectopy.  No significant anemia no significant electrolyte abnormality.  She tells me she had a sip of Gatorade earlier today and that is all she has had all day.  Could be symptomatic hypovolemia.  She is  feeling better not sure that she benefit from IV fluids.  Will discharge home PCP follow-up.  6:37 PM:  I have discussed the diagnosis/risks/treatment options with the patient and friend .  Evaluation and diagnostic testing in the emergency department does not suggest an emergent condition requiring admission or immediate intervention beyond  what has been performed at this time.  They will follow up with PCP. We also discussed returning to the ED immediately if new or worsening sx occur. We discussed the sx which are most concerning (e.g., sudden worsening pain, fever, inability to tolerate by mouth) that necessitate immediate return. Medications administered to the patient during their visit and any new prescriptions provided to the patient are listed below.  Medications given during this visit Medications - No data to display   The patient appears reasonably screen and/or stabilized for discharge and I doubt any other medical condition or other [facility] requiring further screening, evaluation, or treatment in the ED at this time prior to discharge.          Final Clinical Impression(s) / ED Diagnoses Final diagnoses:  Near syncope    Rx / DC Orders ED Discharge Orders     None         [name] Plan, DO 12/16/22 1837

## 2022-12-16 NOTE — ED Triage Notes (Signed)
Pt presents after having 2 episodes of dizziness at work today and feeling nauseated.  Went to UC and was told her EKG was abnormal and she needed to come to the ED.  Pt has had right sided rib pain for the past couple of weeks.
